# Patient Record
Sex: Female | Born: 1955 | Race: Black or African American | Hispanic: No | Smoking: Current every day smoker
Health system: Southern US, Community
[De-identification: ages and names within clinical notes are randomized; demographics above are authoritative.]

## PROBLEM LIST (undated history)

## (undated) DIAGNOSIS — M1990 Unspecified osteoarthritis, unspecified site: Secondary | ICD-10-CM

## (undated) DIAGNOSIS — F419 Anxiety disorder, unspecified: Secondary | ICD-10-CM

## (undated) DIAGNOSIS — N390 Urinary tract infection, site not specified: Secondary | ICD-10-CM

## (undated) DIAGNOSIS — R51 Headache: Secondary | ICD-10-CM

## (undated) DIAGNOSIS — C189 Malignant neoplasm of colon, unspecified: Secondary | ICD-10-CM

## (undated) DIAGNOSIS — Z87442 Personal history of urinary calculi: Secondary | ICD-10-CM

## (undated) DIAGNOSIS — E119 Type 2 diabetes mellitus without complications: Secondary | ICD-10-CM

## (undated) DIAGNOSIS — I10 Essential (primary) hypertension: Secondary | ICD-10-CM

## (undated) DIAGNOSIS — R519 Headache, unspecified: Secondary | ICD-10-CM

## (undated) HISTORY — DX: Malignant neoplasm of colon, unspecified: C18.9

## (undated) HISTORY — PX: HERNIA REPAIR: SHX51

## (undated) HISTORY — PX: ABDOMINAL HYSTERECTOMY: SHX81

## (undated) HISTORY — PX: BACK SURGERY: SHX140

## (undated) HISTORY — DX: Anxiety disorder, unspecified: F41.9

## (undated) HISTORY — DX: Type 2 diabetes mellitus without complications: E11.9

## (undated) HISTORY — PX: CHOLECYSTECTOMY: SHX55

## (undated) HISTORY — DX: Essential (primary) hypertension: I10

---

## 2003-06-20 DIAGNOSIS — E1169 Type 2 diabetes mellitus with other specified complication: Secondary | ICD-10-CM | POA: Diagnosis present

## 2006-06-03 DIAGNOSIS — E669 Obesity, unspecified: Secondary | ICD-10-CM | POA: Insufficient documentation

## 2006-10-24 DIAGNOSIS — G8929 Other chronic pain: Secondary | ICD-10-CM | POA: Insufficient documentation

## 2007-08-31 HISTORY — PX: COLON SURGERY: SHX602

## 2008-01-30 ENCOUNTER — Ambulatory Visit: Payer: Self-pay | Admitting: Cardiology

## 2014-08-30 HISTORY — PX: EUS: SHX5427

## 2014-10-31 ENCOUNTER — Other Ambulatory Visit: Payer: Self-pay | Admitting: Neurology

## 2014-10-31 DIAGNOSIS — M545 Low back pain: Secondary | ICD-10-CM

## 2014-11-11 ENCOUNTER — Ambulatory Visit (HOSPITAL_COMMUNITY)
Admission: RE | Admit: 2014-11-11 | Discharge: 2014-11-11 | Disposition: A | Discharge status: Home / Self Care | Payer: Medicare Other | Source: Ambulatory Visit | Attending: Neurology | Admitting: Neurology

## 2014-11-11 DIAGNOSIS — M5416 Radiculopathy, lumbar region: Secondary | ICD-10-CM | POA: Diagnosis present

## 2014-11-11 DIAGNOSIS — M545 Low back pain: Secondary | ICD-10-CM

## 2017-01-06 ENCOUNTER — Other Ambulatory Visit (HOSPITAL_COMMUNITY): Payer: Self-pay | Admitting: Neurology

## 2017-01-06 ENCOUNTER — Ambulatory Visit (HOSPITAL_COMMUNITY)
Admission: RE | Admit: 2017-01-06 | Discharge: 2017-01-06 | Disposition: A | Discharge status: Home / Self Care | Payer: Medicare Other | Source: Ambulatory Visit | Attending: Neurology | Admitting: Neurology

## 2017-01-06 DIAGNOSIS — R937 Abnormal findings on diagnostic imaging of other parts of musculoskeletal system: Secondary | ICD-10-CM | POA: Diagnosis not present

## 2017-01-06 DIAGNOSIS — S99911A Unspecified injury of right ankle, initial encounter: Secondary | ICD-10-CM | POA: Insufficient documentation

## 2017-01-06 DIAGNOSIS — W19XXXA Unspecified fall, initial encounter: Secondary | ICD-10-CM

## 2017-01-06 DIAGNOSIS — W000XXA Fall on same level due to ice and snow, initial encounter: Secondary | ICD-10-CM | POA: Insufficient documentation

## 2017-03-23 DIAGNOSIS — I10 Essential (primary) hypertension: Secondary | ICD-10-CM | POA: Insufficient documentation

## 2017-09-30 HISTORY — PX: COLONOSCOPY: SHX174

## 2018-02-04 ENCOUNTER — Emergency Department (HOSPITAL_COMMUNITY): Payer: Medicare Other

## 2018-02-04 ENCOUNTER — Inpatient Hospital Stay (HOSPITAL_COMMUNITY)
Admission: EM | Admit: 2018-02-04 | Discharge: 2018-02-08 | DRG: 392 | Disposition: A | Discharge status: Home / Self Care | Payer: Medicare Other | Attending: Internal Medicine | Admitting: Internal Medicine

## 2018-02-04 ENCOUNTER — Encounter (HOSPITAL_COMMUNITY): Payer: Self-pay | Admitting: Emergency Medicine

## 2018-02-04 DIAGNOSIS — Z79899 Other long term (current) drug therapy: Secondary | ICD-10-CM

## 2018-02-04 DIAGNOSIS — R000 Tachycardia, unspecified: Secondary | ICD-10-CM | POA: Diagnosis present

## 2018-02-04 DIAGNOSIS — K208 Other esophagitis: Secondary | ICD-10-CM | POA: Diagnosis present

## 2018-02-04 DIAGNOSIS — F419 Anxiety disorder, unspecified: Secondary | ICD-10-CM | POA: Diagnosis present

## 2018-02-04 DIAGNOSIS — Z7984 Long term (current) use of oral hypoglycemic drugs: Secondary | ICD-10-CM

## 2018-02-04 DIAGNOSIS — E119 Type 2 diabetes mellitus without complications: Secondary | ICD-10-CM | POA: Diagnosis present

## 2018-02-04 DIAGNOSIS — R112 Nausea with vomiting, unspecified: Secondary | ICD-10-CM

## 2018-02-04 DIAGNOSIS — Z85038 Personal history of other malignant neoplasm of large intestine: Secondary | ICD-10-CM | POA: Diagnosis present

## 2018-02-04 DIAGNOSIS — F17210 Nicotine dependence, cigarettes, uncomplicated: Secondary | ICD-10-CM | POA: Diagnosis present

## 2018-02-04 DIAGNOSIS — M5126 Other intervertebral disc displacement, lumbar region: Secondary | ICD-10-CM | POA: Diagnosis present

## 2018-02-04 DIAGNOSIS — Z888 Allergy status to other drugs, medicaments and biological substances status: Secondary | ICD-10-CM

## 2018-02-04 DIAGNOSIS — E785 Hyperlipidemia, unspecified: Secondary | ICD-10-CM | POA: Diagnosis present

## 2018-02-04 DIAGNOSIS — R197 Diarrhea, unspecified: Secondary | ICD-10-CM

## 2018-02-04 DIAGNOSIS — Z90710 Acquired absence of both cervix and uterus: Secondary | ICD-10-CM | POA: Diagnosis not present

## 2018-02-04 DIAGNOSIS — I10 Essential (primary) hypertension: Secondary | ICD-10-CM | POA: Diagnosis present

## 2018-02-04 DIAGNOSIS — Z79891 Long term (current) use of opiate analgesic: Secondary | ICD-10-CM | POA: Diagnosis not present

## 2018-02-04 DIAGNOSIS — Z882 Allergy status to sulfonamides status: Secondary | ICD-10-CM | POA: Diagnosis not present

## 2018-02-04 DIAGNOSIS — M545 Low back pain: Secondary | ICD-10-CM

## 2018-02-04 DIAGNOSIS — R109 Unspecified abdominal pain: Secondary | ICD-10-CM | POA: Diagnosis present

## 2018-02-04 DIAGNOSIS — E860 Dehydration: Secondary | ICD-10-CM

## 2018-02-04 DIAGNOSIS — Z720 Tobacco use: Secondary | ICD-10-CM | POA: Diagnosis present

## 2018-02-04 DIAGNOSIS — Z7982 Long term (current) use of aspirin: Secondary | ICD-10-CM

## 2018-02-04 DIAGNOSIS — M5135 Other intervertebral disc degeneration, thoracolumbar region: Secondary | ICD-10-CM | POA: Diagnosis present

## 2018-02-04 DIAGNOSIS — Z9049 Acquired absence of other specified parts of digestive tract: Secondary | ICD-10-CM

## 2018-02-04 DIAGNOSIS — Z91041 Radiographic dye allergy status: Secondary | ICD-10-CM

## 2018-02-04 DIAGNOSIS — R1032 Left lower quadrant pain: Secondary | ICD-10-CM

## 2018-02-04 DIAGNOSIS — R1031 Right lower quadrant pain: Secondary | ICD-10-CM

## 2018-02-04 DIAGNOSIS — K529 Noninfective gastroenteritis and colitis, unspecified: Secondary | ICD-10-CM | POA: Diagnosis present

## 2018-02-04 DIAGNOSIS — R7989 Other specified abnormal findings of blood chemistry: Secondary | ICD-10-CM | POA: Diagnosis present

## 2018-02-04 DIAGNOSIS — G8929 Other chronic pain: Secondary | ICD-10-CM | POA: Diagnosis present

## 2018-02-04 DIAGNOSIS — Z716 Tobacco abuse counseling: Secondary | ICD-10-CM

## 2018-02-04 DIAGNOSIS — E1142 Type 2 diabetes mellitus with diabetic polyneuropathy: Secondary | ICD-10-CM

## 2018-02-04 LAB — URINALYSIS, ROUTINE W REFLEX MICROSCOPIC
Bilirubin Urine: NEGATIVE
KETONES UR: NEGATIVE mg/dL
LEUKOCYTES UA: NEGATIVE
NITRITE: NEGATIVE
PH: 5 (ref 5.0–8.0)
Protein, ur: 30 mg/dL — AB
Specific Gravity, Urine: 1.018 (ref 1.005–1.030)

## 2018-02-04 LAB — GLUCOSE, CAPILLARY: Glucose-Capillary: 178 mg/dL — ABNORMAL HIGH (ref 65–99)

## 2018-02-04 LAB — COMPREHENSIVE METABOLIC PANEL
ALK PHOS: 98 U/L (ref 38–126)
ALT: 20 U/L (ref 14–54)
ANION GAP: 13 (ref 5–15)
AST: 23 U/L (ref 15–41)
Albumin: 4.5 g/dL (ref 3.5–5.0)
BUN: 15 mg/dL (ref 6–20)
CALCIUM: 9.4 mg/dL (ref 8.9–10.3)
CO2: 26 mmol/L (ref 22–32)
CREATININE: 0.73 mg/dL (ref 0.44–1.00)
Chloride: 98 mmol/L — ABNORMAL LOW (ref 101–111)
Glucose, Bld: 280 mg/dL — ABNORMAL HIGH (ref 65–99)
Potassium: 4.2 mmol/L (ref 3.5–5.1)
SODIUM: 137 mmol/L (ref 135–145)
Total Bilirubin: 0.8 mg/dL (ref 0.3–1.2)
Total Protein: 8.7 g/dL — ABNORMAL HIGH (ref 6.5–8.1)

## 2018-02-04 LAB — TROPONIN I: TROPONIN I: 0.03 ng/mL — AB (ref <0.03)

## 2018-02-04 LAB — CBC
HCT: 45.1 % (ref 36.0–46.0)
HEMOGLOBIN: 15.3 g/dL — AB (ref 12.0–15.0)
MCH: 30.7 pg (ref 26.0–34.0)
MCHC: 33.9 g/dL (ref 30.0–36.0)
MCV: 90.4 fL (ref 78.0–100.0)
PLATELETS: 312 10*3/uL (ref 150–400)
RBC: 4.99 MIL/uL (ref 3.87–5.11)
RDW: 13 % (ref 11.5–15.5)
WBC: 7.9 10*3/uL (ref 4.0–10.5)

## 2018-02-04 LAB — D-DIMER, QUANTITATIVE: D-Dimer, Quant: 0.81 ug/mL-FEU — ABNORMAL HIGH (ref 0.00–0.50)

## 2018-02-04 LAB — LIPASE, BLOOD: LIPASE: 26 U/L (ref 11–51)

## 2018-02-04 LAB — I-STAT CG4 LACTIC ACID, ED
Lactic Acid, Venous: 2.25 mmol/L (ref 0.5–1.9)
Lactic Acid, Venous: 1.67 mmol/L (ref 0.5–1.9)

## 2018-02-04 MED ORDER — SODIUM CHLORIDE 0.9 % IV BOLUS
1000.0000 mL | Freq: Once | INTRAVENOUS | Status: AC
  Administered 2018-02-04: 1000 mL via INTRAVENOUS

## 2018-02-04 MED ORDER — VITAMIN D3 25 MCG (1000 UNIT) PO TABS
1000.0000 [IU] | ORAL_TABLET | Freq: Every day | ORAL | Status: DC
  Administered 2018-02-05 – 2018-02-08 (×4): 1000 [IU] via ORAL
  Filled 2018-02-04 (×4): qty 1

## 2018-02-04 MED ORDER — LISINOPRIL-HYDROCHLOROTHIAZIDE 20-12.5 MG PO TABS: 1.0000 | ORAL_TABLET | Freq: Every day | ORAL | Status: DC

## 2018-02-04 MED ORDER — MORPHINE SULFATE (PF) 4 MG/ML IV SOLN
4.0000 mg | Freq: Once | INTRAVENOUS | Status: AC
  Administered 2018-02-04: 4 mg via INTRAVENOUS
  Filled 2018-02-04: qty 1

## 2018-02-04 MED ORDER — NICOTINE 21 MG/24HR TD PT24
21.0000 mg | MEDICATED_PATCH | Freq: Every day | TRANSDERMAL | Status: DC
  Administered 2018-02-06: 21 mg via TRANSDERMAL
  Filled 2018-02-04 (×4): qty 1

## 2018-02-04 MED ORDER — ENOXAPARIN SODIUM 40 MG/0.4ML ~~LOC~~ SOLN
40.0000 mg | Freq: Every day | SUBCUTANEOUS | Status: DC
Start: 2018-02-04 — End: 2018-02-08
  Administered 2018-02-04 – 2018-02-07 (×4): 40 mg via SUBCUTANEOUS
  Filled 2018-02-04 (×4): qty 0.4

## 2018-02-04 MED ORDER — METOPROLOL TARTRATE 25 MG PO TABS
25.0000 mg | ORAL_TABLET | Freq: Every day | ORAL | Status: DC
  Administered 2018-02-05: 25 mg via ORAL
  Filled 2018-02-04: qty 1

## 2018-02-04 MED ORDER — SIMVASTATIN 40 MG PO TABS
40.0000 mg | ORAL_TABLET | Freq: Every day | ORAL | Status: DC
  Administered 2018-02-05: 40 mg via ORAL
  Filled 2018-02-04: qty 1

## 2018-02-04 MED ORDER — HYDROCODONE-ACETAMINOPHEN 10-325 MG PO TABS
1.0000 | ORAL_TABLET | ORAL | Status: DC
  Administered 2018-02-04 – 2018-02-06 (×8): 1 via ORAL
  Filled 2018-02-04 (×9): qty 1

## 2018-02-04 MED ORDER — DIPHENHYDRAMINE HCL 25 MG PO CAPS
50.0000 mg | ORAL_CAPSULE | Freq: Every day | ORAL | Status: DC
Start: 2018-02-04 — End: 2018-02-08
  Administered 2018-02-04 – 2018-02-07 (×3): 50 mg via ORAL
  Filled 2018-02-04 (×4): qty 2

## 2018-02-04 MED ORDER — ALPRAZOLAM 1 MG PO TABS: 1.0000 mg | ORAL_TABLET | Freq: Two times a day (BID) | ORAL | Status: DC | PRN

## 2018-02-04 MED ORDER — MORPHINE SULFATE (PF) 2 MG/ML IV SOLN
2.0000 mg | INTRAVENOUS | Status: DC | PRN
  Administered 2018-02-04 – 2018-02-06 (×5): 2 mg via INTRAVENOUS
  Filled 2018-02-04 (×6): qty 1

## 2018-02-04 MED ORDER — ASPIRIN EC 325 MG PO TBEC
325.0000 mg | DELAYED_RELEASE_TABLET | Freq: Every day | ORAL | Status: DC
  Administered 2018-02-05 – 2018-02-08 (×4): 325 mg via ORAL
  Filled 2018-02-04 (×4): qty 1

## 2018-02-04 MED ORDER — VITAMIN B-12 1000 MCG PO TABS
1000.0000 ug | ORAL_TABLET | Freq: Every day | ORAL | Status: DC
  Administered 2018-02-05 – 2018-02-08 (×4): 1000 ug via ORAL
  Filled 2018-02-04 (×4): qty 1

## 2018-02-04 MED ORDER — ONDANSETRON HCL 4 MG/2ML IJ SOLN
4.0000 mg | Freq: Once | INTRAMUSCULAR | Status: AC
  Administered 2018-02-04: 4 mg via INTRAVENOUS
  Filled 2018-02-04: qty 2

## 2018-02-04 MED ORDER — TIZANIDINE HCL 4 MG PO TABS
4.0000 mg | ORAL_TABLET | Freq: Every day | ORAL | Status: DC
  Administered 2018-02-04 – 2018-02-07 (×4): 4 mg via ORAL
  Filled 2018-02-04 (×4): qty 1

## 2018-02-04 MED ORDER — CLONIDINE HCL 0.1 MG PO TABS
0.1000 mg | ORAL_TABLET | Freq: Every day | ORAL | Status: DC
Start: 2018-02-05 — End: 2018-02-08
  Administered 2018-02-05 – 2018-02-08 (×4): 0.1 mg via ORAL
  Filled 2018-02-04 (×4): qty 1

## 2018-02-04 MED ORDER — SODIUM CHLORIDE 0.9 % IV BOLUS
500.0000 mL | Freq: Once | INTRAVENOUS | Status: AC
  Administered 2018-02-04: 500 mL via INTRAVENOUS

## 2018-02-04 MED ORDER — POTASSIUM CHLORIDE CRYS ER 20 MEQ PO TBCR
20.0000 meq | EXTENDED_RELEASE_TABLET | Freq: Every day | ORAL | Status: DC
  Administered 2018-02-05 – 2018-02-08 (×4): 20 meq via ORAL
  Filled 2018-02-04 (×4): qty 1

## 2018-02-04 MED ORDER — HYDROCHLOROTHIAZIDE 12.5 MG PO CAPS
12.5000 mg | ORAL_CAPSULE | Freq: Every day | ORAL | Status: DC
  Administered 2018-02-05 – 2018-02-08 (×4): 12.5 mg via ORAL
  Filled 2018-02-04 (×4): qty 1

## 2018-02-04 MED ORDER — ONDANSETRON HCL 4 MG/2ML IJ SOLN
4.0000 mg | Freq: Four times a day (QID) | INTRAMUSCULAR | Status: DC | PRN
  Administered 2018-02-06 – 2018-02-07 (×2): 4 mg via INTRAVENOUS
  Filled 2018-02-04 (×2): qty 2

## 2018-02-04 MED ORDER — GLIPIZIDE 10 MG PO TABS
10.0000 mg | ORAL_TABLET | Freq: Two times a day (BID) | ORAL | Status: DC
  Administered 2018-02-05 – 2018-02-08 (×6): 10 mg via ORAL
  Filled 2018-02-04 (×7): qty 1

## 2018-02-04 MED ORDER — HALOPERIDOL LACTATE 5 MG/ML IJ SOLN
2.5000 mg | Freq: Once | INTRAMUSCULAR | Status: AC
  Administered 2018-02-04: 2.5 mg via INTRAVENOUS
  Filled 2018-02-04: qty 1

## 2018-02-04 MED ORDER — SODIUM CHLORIDE 0.9 % IV SOLN
INTRAVENOUS | Status: DC
  Administered 2018-02-04: 23:00:00 via INTRAVENOUS

## 2018-02-04 MED ORDER — LISINOPRIL 20 MG PO TABS
20.0000 mg | ORAL_TABLET | Freq: Every day | ORAL | Status: DC
  Administered 2018-02-05 – 2018-02-08 (×4): 20 mg via ORAL
  Filled 2018-02-04 (×4): qty 1

## 2018-02-04 MED ORDER — INSULIN ASPART 100 UNIT/ML ~~LOC~~ SOLN
0.0000 [IU] | Freq: Three times a day (TID) | SUBCUTANEOUS | Status: DC
  Administered 2018-02-05 (×2): 3 [IU] via SUBCUTANEOUS
  Administered 2018-02-05: 5 [IU] via SUBCUTANEOUS
  Administered 2018-02-06: 7 [IU] via SUBCUTANEOUS

## 2018-02-04 MED ORDER — ONDANSETRON HCL 4 MG PO TABS: 4.0000 mg | ORAL_TABLET | Freq: Four times a day (QID) | ORAL | Status: DC | PRN

## 2018-02-04 NOTE — H&P (Signed)
History and Physical    Candice Mccann IZT:[id number] DOB: [birth year] DOA: 02/04/2018  Referring MD/NP/PA: Benedetto Goad, PA  PCP: Celedonio Savage, MD   Outpatient Specialists: None   Patient coming from: Home  Chief Complaint: Abdominal pain, nausea with vomiting  HPI: Candice Mccann is a 62 y.o. female with medical history significant of diabetes, colon cancer status post resection, degenerative disc disease status post previous back surgeries, hypertension, hyperlipidemia and anxiety disorder who presented to the ER with persistent left flank pain going to her abdomen with nausea and vomiting.  Symptoms have been going on for several weeks on and off.  She has been to different emergency rooms to have that evaluated.  Has had CT scans and other test results in [place] which were all negative.  Family brought her here to the ER for further evaluation.  Patient's work-up so far has been negative however she was noted to have persistent sinus tachycardia.  Despite fluid resuscitation and pain medications her heart rate has remained in the 120s to 140s.  Family reported that they have been using a lot of pesticides around the house.  She does not seem to have other symptoms of organophosphate poisoning.  Not clear what other chemicals in the pesticides that the use.  Family reported previous back surgeries.  The nature of the pain is from the back to the front.  Rated as 10 out of 10 not relieved by anything aggravated by movement and even at rest.  ED Course: Patient's vitals indicate a blood pressure of 203 about 117 pulse of 128 respiratory 20 and oxygen sat 93% on room air.  White count is 7.9 with hemoglobin 15.3.  Troponin 0 0.03 chloride 98 but regular creatinine.  Glucose is 280.  Review of Systems: As per HPI otherwise 10 point review of systems negative.    Past Medical History:  Diagnosis Date  . Cancer [facility])    colon cancer     Past Surgical History:  Procedure  Laterality Date  . ABDOMINAL HYSTERECTOMY    . APPENDECTOMY    . BACK SURGERY    . HERNIA REPAIR       reports that she has been smoking cigarettes.  She has never used smokeless tobacco. She reports that she drank alcohol. Her drug history is not on file.  Allergies  Allergen Reactions  . Contrast Media [Iodinated Diagnostic Agents] Anaphylaxis and Nausea And Vomiting  . Nitroglycerin Other (See Comments)    Cardiac arrest  . Sulfa Antibiotics Rash    No family history on file.   Prior to Admission medications   Medication Sig Start Date End Date Taking? Authorizing Provider  ALPRAZolam Duanne Moron) 1 MG tablet Take 1 mg by mouth 2 (two) times daily as needed for anxiety.   Yes [provider]  aspirin 325 MG EC tablet Take 325 mg by mouth daily.   Yes [provider]  cholecalciferol (VITAMIN D) 1000 units tablet Take 1,000 Units by mouth daily.   Yes [provider]  cloNIDine (CATAPRES) 0.1 MG tablet Take 0.1 mg by mouth daily.   Yes [provider]  diphenhydramine-acetaminophen (TYLENOL PM) 25-500 MG TABS tablet Take 2 tablets by mouth at bedtime.   Yes [provider]  glipiZIDE (GLUCOTROL) 10 MG tablet Take 10 mg by mouth 2 (two) times daily before a meal.   Yes [provider]  HYDROcodone-acetaminophen (NORCO) 10-325 MG tablet Take 1 tablet by mouth every 4 (four)  hours.   Yes [provider]  lisinopril-hydrochlorothiazide (PRINZIDE,ZESTORETIC) 20-12.5 MG tablet Take 1 tablet by mouth daily.   Yes [provider]  metFORMIN (GLUCOPHAGE) 500 MG tablet Take 500 mg by mouth 2 (two) times daily with a meal.   Yes [provider]  metoprolol tartrate (LOPRESSOR) 25 MG tablet Take 25 mg by mouth daily.   Yes [provider]  potassium chloride SA (K-DUR,KLOR-CON) 20 MEQ tablet Take 20 mEq by mouth daily.   Yes [provider]  simvastatin (ZOCOR) 40 MG tablet Take 40 mg by mouth daily  at 6 PM.   Yes [provider]  tiZANidine (ZANAFLEX) 4 MG capsule Take 4 mg by mouth at bedtime.   Yes [provider]  vitamin B-12 (CYANOCOBALAMIN) 1000 MCG tablet Take 1,000 mcg by mouth daily.   Yes [provider]    Physical Exam: Vitals:   02/04/18 1807 02/04/18 1830 02/04/18 1837 02/04/18 1900  BP: (!) 168/102 (!) 165/110 (!) 165/110 (!) 183/113  Pulse: (!) 114 (!) 123 (!) 123 (!) 123  Resp: 16  16   Temp:      TempSrc:      SpO2: 97% 93% 93% 95%  Weight:      Height:          Constitutional: NAD, calm, comfortable Vitals:   02/04/18 1807 02/04/18 1830 02/04/18 1837 02/04/18 1900  BP: (!) 168/102 (!) 165/110 (!) 165/110 (!) 183/113  Pulse: (!) 114 (!) 123 (!) 123 (!) 123  Resp: 16  16   Temp:      TempSrc:      SpO2: 97% 93% 93% 95%  Weight:      Height:       Eyes: PERRL, lids and conjunctivae normal ENMT: Mucous membranes are moist. Posterior pharynx clear of any exudate or lesions.Normal dentition.  Neck: normal, supple, no masses, no thyromegaly Respiratory: clear to auscultation bilaterally, no wheezing, no crackles. Normal respiratory effort. No accessory muscle use.  Cardiovascular: Regular rate and rhythm, no murmurs / rubs / gallops. No extremity edema. 2+ pedal pulses. No carotid bruits.  Abdomen: Diffuse tenderness, no masses palpated. No hepatosplenomegaly. Bowel sounds positive.  Musculoskeletal: no clubbing / cyanosis. No joint deformity upper and lower extremities. Good ROM, no contractures. Normal muscle tone.  Skin: no rashes, lesions, ulcers. No induration Neurologic: CN 2-12 grossly intact. Sensation intact, DTR normal. Strength 5/5 in all 4.  Psychiatric: Normal judgment and insight. Alert and oriented x 3. Normal mood.   Labs on Admission: I have personally reviewed following labs and imaging studies  CBC: Recent Labs  Lab 02/04/18 1351  WBC 7.9  HGB 15.3*  HCT 45.1  MCV 90.4  PLT 347   Basic Metabolic  Panel: Recent Labs  Lab 02/04/18 1351  NA 137  K 4.2  CL 98*  CO2 26  GLUCOSE 280*  BUN 15  CREATININE 0.73  CALCIUM 9.4   GFR: Estimated Creatinine Clearance: 69.3 mL/min (by C-G formula based on SCr of 0.73 mg/dL). Liver Function Tests: Recent Labs  Lab 02/04/18 1351  AST 23  ALT 20  ALKPHOS 98  BILITOT 0.8  PROT 8.7*  ALBUMIN 4.5   Recent Labs  Lab 02/04/18 1351  LIPASE 26   No results for input(s): AMMONIA in the last 168 hours. Coagulation Profile: No results for input(s): INR, PROTIME in the last 168 hours. Cardiac Enzymes: Recent Labs  Lab 02/04/18 1522  TROPONINI 0.03*   BNP (last 3 results)  No results for input(s): PROBNP in the last 8760 hours. HbA1C: No results for input(s): HGBA1C in the last 72 hours. CBG: No results for input(s): GLUCAP in the last 168 hours. Lipid Profile: No results for input(s): CHOL, HDL, LDLCALC, TRIG, CHOLHDL, LDLDIRECT in the last 72 hours. Thyroid Function Tests: No results for input(s): TSH, T4TOTAL, FREET4, T3FREE, THYROIDAB in the last 72 hours. Anemia Panel: No results for input(s): VITAMINB12, FOLATE, FERRITIN, TIBC, IRON, RETICCTPCT in the last 72 hours. Urine analysis:    Component Value Date/Time   COLORURINE YELLOW 02/04/2018 1351   APPEARANCEUR CLEAR 02/04/2018 1351   LABSPEC 1.018 02/04/2018 1351   PHURINE 5.0 02/04/2018 1351   GLUCOSEU >=500 (A) 02/04/2018 1351   HGBUR MODERATE (A) 02/04/2018 1351   BILIRUBINUR NEGATIVE 02/04/2018 1351   KETONESUR NEGATIVE 02/04/2018 1351   PROTEINUR 30 (A) 02/04/2018 1351   NITRITE NEGATIVE 02/04/2018 1351   LEUKOCYTESUR NEGATIVE 02/04/2018 1351   Sepsis Labs: @LABRCNTIP (procalcitonin:4,lacticidven:4) )No results found for this or any previous visit (from the past 240 hour(s)).   Radiological Exams on Admission: Dg Abdomen Acute W/chest  Result Date: 02/04/2018 CLINICAL DATA:  Several weeks of abdominal pain radiating to back with nausea and vomiting. EXAM: DG  ABDOMEN ACUTE W/ 1V CHEST COMPARISON:  None. FINDINGS: Chest radiograph demonstrates the lungs to be clear. Cardiomediastinal silhouette is within normal. Remainder of the chest radiograph is unremarkable. Abdominopelvic films demonstrate a nonobstructive bowel gas pattern. There is no focal mass or mass effect. No free peritoneal air. Surgical clips over the right upper quadrant. Minimal degenerate change of the spine. IMPRESSION: Nonobstructive bowel gas pattern. No acute cardiopulmonary disease. Electronically Signed   By: Marin Olp M.D.   On: 02/04/2018 20:11    EKG: Independently reviewed.  Showed sinus tachycardia with a rate of 111.  No ST changes  Assessment/Plan Principal Problem:   Sinus tachycardia Active Problems:   Tobacco abuse   Abdominal pain   History of colon cancer   Benign essential HTN   DDD (degenerative disc disease), thoracolumbar   DM (diabetes mellitus) (HCC)   Hyperlipemia   Anxiety disorder     #1 sinus tachycardia: Most likely related to patient's abdominal pain and pain in general.  Hydrated so far.  Admit the patient to telemetry.  Evaluate for other possible causes.  D-dimer is only minimally elevated however patient cannot get contrast.  We may have to do a VQ scan in the morning if no improvement.  There is question of possible pesticides poisoning and we do not know what chemicals she got exposed to. Organophosphates will cause bradycardia so this is less likely to be the cause.  #2 recurrent abdominal pain: again work-up so far negative.  I suspect radicular pain from the lumbar spine.  With her history of back surgery and degenerative disc disease, we will order an MRI of the lumbar spine and thoracic spine.  #3 degenerative disc disease of the spine: As indicated above recheck MRI.  Pain management.  #4  diabetes: Patient on oral hypoglycemics.  Hold metformin and get sliding scale insulin.  #5 anxiety disorder: Continue with home regimen.  #6  hypertension: Continue with home regimen and monitor blood pressure closely  #7 hyperlipidemia: Again continue with patient's home regimen.  #8 tobacco abuse: Tobacco cessation counseling given.  Add nicotine patch   DVT prophylaxis: Lovenox  Code Status: Full  Family Communication: Husband at bedside Disposition Plan: To be determined  Consults called: None  Admission status: Inpatient  Severity of Illness: The appropriate patient status for this patient is INPATIENT. Inpatient status is judged to be reasonable and necessary in order to provide the required intensity of service to ensure the patient's safety. The patient's presenting symptoms, physical exam findings, and initial radiographic and laboratory data in the context of their chronic comorbidities is felt to place them at high risk for further clinical deterioration. Furthermore, it is not anticipated that the patient will be medically stable for discharge from the hospital within 2 midnights of admission. The following factors support the patient status of inpatient.   " The patient's presenting symptoms include left flank to abdominal pain. " The worrisome physical exam findings include tenderness in the abdomen and left flank. " The initial radiographic and laboratory data are worrisome because of negative CT but increased d-dimer. " The chronic co-morbidities include history of colon cancer.   * I certify that at the point of admission it is my clinical judgment that the patient will require inpatient hospital care spanning beyond 2 midnights from the point of admission due to high intensity of service, high risk for further deterioration and high frequency of surveillance required.Barbette Merino MD [facility] Pager [phone]  If 7PM-7AM, please contact night-coverage [email or web address] Password [email or web address]  02/04/2018, 9:20 PM

## 2018-02-04 NOTE — ED Notes (Signed)
ED TO INPATIENT HANDOFF REPORT  Name/Age/Gender Candice Mccann 62 y.o. female  Code Status   Home/SNF/Other Home  Chief Complaint side pain  Level of Care/Admitting Diagnosis ED Disposition    ED Disposition Condition [facility] Area: [place] [100102]  Level of Care: Telemetry [5]  Admit to tele based on following criteria: Complex arrhythmia (Bradycardia/Tachycardia)  Diagnosis: Sinus tachycardia [[id number]]  Admitting Physician: Elwyn Reach [[id number]]  Attending Physician: Elwyn Reach [[id number]]  Estimated length of stay: past midnight tomorrow  Certification:: I certify this patient will need inpatient services for at least 2 midnights  PT Class (Do Not Modify): Inpatient [101]  PT Acc Code (Do Not Modify): Private [1]       Medical History Past Medical History:  Diagnosis Date  . Cancer [facility])    colon cancer     Allergies Allergies  Allergen Reactions  . Contrast Media [Iodinated Diagnostic Agents] Anaphylaxis and Nausea And Vomiting  . Nitroglycerin Other (See Comments)    Cardiac arrest  . Sulfa Antibiotics Rash    IV Location/Drains/Wounds Patient Lines/Drains/Airways Status   Active Line/Drains/Airways    Name:   Placement date:   Placement time:   Site:   Days:   Peripheral IV 02/04/18 Left Antecubital   02/04/18    1423    Antecubital   less than 1          Labs/Imaging Results for orders placed or performed during the hospital encounter of 02/04/18 (from the past 48 hour(s))  Lipase, blood     Status: None   Collection Time: 02/04/18  1:51 PM  Result Value Ref Range   Lipase 26 11 - 51 U/L    Comment: Performed at [facility], [place] [address] [place], [place] [address]  Comprehensive metabolic panel     Status: Abnormal   Collection Time: 02/04/18  1:51 PM  Result Value Ref Range   Sodium 137 135 - 145 mmol/L   Potassium 4.2 3.5 - 5.1 mmol/L   Chloride 98 (L) 101 - 111  mmol/L   CO2 26 22 - 32 mmol/L   Glucose, Bld 280 (H) 65 - 99 mg/dL   BUN 15 6 - 20 mg/dL   Creatinine, Ser 0.73 0.44 - 1.00 mg/dL   Calcium 9.4 8.9 - 10.3 mg/dL   Total Protein 8.7 (H) 6.5 - 8.1 g/dL   Albumin 4.5 3.5 - 5.0 g/dL   AST 23 15 - 41 U/L   ALT 20 14 - 54 U/L   Alkaline Phosphatase 98 38 - 126 U/L   Total Bilirubin 0.8 0.3 - 1.2 mg/dL   GFR calc non Af Amer >60 >60 mL/min   GFR calc Af Amer >60 >60 mL/min    Comment: (NOTE) The eGFR has been calculated using the CKD EPI equation. This calculation has not been validated in all clinical situations. eGFR's persistently <60 mL/min signify possible Chronic Kidney Disease.    Anion gap 13 5 - 15    Comment: Performed at [facility], [place] [address], [place], [place] [address]  CBC     Status: Abnormal   Collection Time: 02/04/18  1:51 PM  Result Value Ref Range   WBC 7.9 4.0 - 10.5 K/uL   RBC 4.99 3.87 - 5.11 MIL/uL   Hemoglobin 15.3 (H) 12.0 - 15.0 g/dL   HCT 45.1 36.0 - 46.0 %   MCV 90.4 78.0 - 100.0 fL  MCH 30.7 26.0 - 34.0 pg   MCHC 33.9 30.0 - 36.0 g/dL   RDW 13.0 11.5 - 15.5 %   Platelets 312 150 - 400 K/uL    Comment: Performed at [facility], [place] [address], [place], [place] [address]  Urinalysis, Routine w reflex microscopic     Status: Abnormal   Collection Time: 02/04/18  1:51 PM  Result Value Ref Range   Color, Urine YELLOW YELLOW   APPearance CLEAR CLEAR   Specific Gravity, Urine 1.018 1.005 - 1.030   pH 5.0 5.0 - 8.0   Glucose, UA >=500 (A) NEGATIVE mg/dL   Hgb urine dipstick MODERATE (A) NEGATIVE   Bilirubin Urine NEGATIVE NEGATIVE   Ketones, ur NEGATIVE NEGATIVE mg/dL   Protein, ur 30 (A) NEGATIVE mg/dL   Nitrite NEGATIVE NEGATIVE   Leukocytes, UA NEGATIVE NEGATIVE   RBC / HPF 0-5 0 - 5 RBC/hpf   WBC, UA 0-5 0 - 5 WBC/hpf   Bacteria, UA RARE (A) NONE SEEN   Squamous Epithelial / LPF 6-10 0 - 5   Hyaline Casts, UA PRESENT     Comment: Performed at  [facility], [place] [address], [place], [place] [address]  Troponin I     Status: Abnormal   Collection Time: 02/04/18  3:22 PM  Result Value Ref Range   Troponin I 0.03 (HH) <0.03 ng/mL    Comment: CRITICAL RESULT CALLED TO, READ BACK BY AND VERIFIED WITH: J.Everlean Patterson 02/04/18 _0  BY V.WILKINS Performed at [facility], [place] [address] [place], [place] [address]   I-Stat CG4 Lactic Acid, ED     Status: Abnormal   Collection Time: 02/04/18  3:53 PM  Result Value Ref Range   Lactic Acid, Venous 2.25 (HH) 0.5 - 1.9 mmol/L   Comment NOTIFIED PHYSICIAN   I-Stat CG4 Lactic Acid, ED     Status: None   Collection Time: 02/04/18  6:14 PM  Result Value Ref Range   Lactic Acid, Venous 1.67 0.5 - 1.9 mmol/L  D-dimer, quantitative (not at [facility])     Status: Abnormal   Collection Time: 02/04/18  8:31 PM  Result Value Ref Range   D-Dimer, Quant 0.81 (H) 0.00 - 0.50 ug/mL-FEU    Comment: (NOTE) At the manufacturer cut-off of 0.50 ug/mL FEU, this assay has been documented to exclude PE with a sensitivity and negative predictive value of 97 to 99%.  At this time, this assay has not been approved by the FDA to exclude DVT/VTE. Results should be correlated with clinical presentation. Performed at [facility], [place] [address], [place], [place] [address]    Dg Abdomen Acute W/chest  Result Date: 02/04/2018 CLINICAL DATA:  Several weeks of abdominal pain radiating to back with nausea and vomiting. EXAM: DG ABDOMEN ACUTE W/ 1V CHEST COMPARISON:  None. FINDINGS: Chest radiograph demonstrates the lungs to be clear. Cardiomediastinal silhouette is within normal. Remainder of the chest radiograph is unremarkable. Abdominopelvic films demonstrate a nonobstructive bowel gas pattern. There is no focal mass or mass effect. No free peritoneal air. Surgical clips over the right upper quadrant. Minimal degenerate change of the spine. IMPRESSION:  Nonobstructive bowel gas pattern. No acute cardiopulmonary disease. Electronically Signed   By: Marin Olp M.D.   On: 02/04/2018 20:11    Pending Labs Unresulted Labs (From admission, onward)   Start     Ordered   02/04/18 1913  Troponin I  STAT,   STAT  02/04/18 1912   Signed and Held  HIV antibody (Routine Testing)  Once,   R     Signed and Held   Signed and Held  Hemoglobin A1c  Once,   R    Comments:  To assess prior glycemic control    Signed and Held   Signed and Held  CBC  (enoxaparin (LOVENOX)    CrCl >/= 30 ml/min)  Once,   R    Comments:  Baseline for enoxaparin therapy IF NOT ALREADY DRAWN.  Notify MD if PLT < 100 K.    Signed and Held   Signed and Held  Creatinine, serum  (enoxaparin (LOVENOX)    CrCl >/= 30 ml/min)  Once,   R    Comments:  Baseline for enoxaparin therapy IF NOT ALREADY DRAWN.    Signed and Held   Signed and Held  Creatinine, serum  (enoxaparin (LOVENOX)    CrCl >/= 30 ml/min)  Weekly,   R    Comments:  while on enoxaparin therapy    Signed and Held   Signed and Held  CBC  Tomorrow morning,   R     Signed and Held   Signed and Held  Comprehensive metabolic panel  Tomorrow morning,   R     Signed and Held   Signed and Held  Urine rapid drug screen (hosp performed)  STAT,   R     Signed and Held      Vitals/Pain Today's Vitals   02/04/18 1807 02/04/18 1830 02/04/18 1837 02/04/18 1900  BP: (!) 168/102 (!) 165/110 (!) 165/110 (!) 183/113  Pulse: (!) 114 (!) 123 (!) 123 (!) 123  Resp: 16  16   Temp:      TempSrc:      SpO2: 97% 93% 93% 95%  Weight:      Height:      PainSc:        Isolation Precautions No active isolations  Medications Medications  morphine 4 MG/ML injection 4 mg (4 mg Intravenous Given 02/04/18 1424)  ondansetron (ZOFRAN) injection 4 mg (4 mg Intravenous Given 02/04/18 1424)  sodium chloride 0.9 % bolus 1,000 mL (0 mLs Intravenous Stopped 02/04/18 1548)  morphine 4 MG/ML injection 4 mg (4 mg Intravenous Given 02/04/18  1548)  sodium chloride 0.9 % bolus 500 mL (0 mLs Intravenous Stopped 02/04/18 1728)  haloperidol lactate (HALDOL) injection 2.5 mg (2.5 mg Intravenous Given 02/04/18 1806)  sodium chloride 0.9 % bolus 1,000 mL (0 mLs Intravenous Stopped 02/04/18 2158)    Mobility walks

## 2018-02-04 NOTE — ED Triage Notes (Signed)
Pt reports that for several weeks having abd pain that radiates to her back with n/v. Pt was told that scar tissue, but denies having abd surgeries. Denies urinary problems.

## 2018-02-04 NOTE — ED Provider Notes (Signed)
[facility] DEPT Provider Note   CSN: [id number] Arrival date & time: 02/04/18  1335     History   Chief Complaint Chief Complaint  Patient presents with  . Abdominal Pain  . Emesis    HPI Candice Mccann is a 62 y.o. female.  Candice Mccann is a 62 y.o. Female with a history of colon cancer s/p resection, and chronic abdominal pain, who presents to the emergency department for evaluation of several weeks of lower abdominal pain that radiates to the left side of the lower back.  Patient reports she has had issues with this abdominal pain for several months and on review patient's chart it looks like this is been a problem since at least November 2018.  Patient reports over the past few months pain has been getting progressively worse, she was seen by her primary care doctor yesterday but given persisting pain she came here to the ED, she reports she has been seen at the ED and even 3 times in the last few weeks, but wanted to try coming here to get some answers.  She reports pain is constant, it is associated with nausea and several episodes of nonbilious nonbloody emesis.  She also reports diarrhea, denies any melena or hematochezia.  She reports pain today is most severe in the left lower quadrant and feels like it wraps around to her back, she also has history of issues with back pain.  Her doctor prescribed her tizanidine, she also reports she has been prescribed tramadol and Norco recently for this pain.     Past Medical History:  Diagnosis Date  . Cancer [facility])    colon cancer     There are no active problems to display for this patient.   Past Surgical History:  Procedure Laterality Date  . ABDOMINAL HYSTERECTOMY    . APPENDECTOMY    . BACK SURGERY    . HERNIA REPAIR       OB History   None      Home Medications    Prior to Admission medications   Not on File    Family History No family history on  file.  Social History Social History   Tobacco Use  . Smoking status: Current Every Day Smoker    Types: Cigarettes  . Smokeless tobacco: Never Used  Substance Use Topics  . Alcohol use: Not Currently  . Drug use: Not on file     Allergies   Nitroglycerin   Review of Systems Review of Systems  Constitutional: Negative for chills and fever.  HENT: Negative for congestion, rhinorrhea and sore throat.   Eyes: Negative for visual disturbance.  Respiratory: Negative for cough and shortness of breath.   Cardiovascular: Negative for chest pain.  Gastrointestinal: Positive for abdominal pain, diarrhea, nausea and vomiting. Negative for blood in stool.  Genitourinary: Negative for dysuria, flank pain and frequency.  Musculoskeletal: Positive for back pain. Negative for arthralgias and neck pain.  Skin: Negative for color change and rash.  Neurological: Negative for dizziness, syncope and light-headedness.     Physical Exam Updated Vital Signs BP (!) 184/104 (BP Location: Left Arm)   Pulse (!) 134   Temp 98.1 F (36.7 C) (Oral)   Resp (!) 22   Ht 5\' 2"  (1.575 m)   Wt 75.3 kg (166 lb)   SpO2 98%   BMI 30.36 kg/m   Physical Exam  Constitutional: She appears well-developed and well-nourished. No distress.  Patient  appears uncomfortable and in pain, but is in no acute distress.  HENT:  Head: Normocephalic and atraumatic.  Oropharynx is clear, mucous membranes appear slightly dry  Eyes: Right eye exhibits no discharge. Left eye exhibits no discharge.  Neck: Neck supple.  Cardiovascular: Regular rhythm, normal heart sounds and intact distal pulses.  Tachycardia  Pulmonary/Chest: Effort normal and breath sounds normal. No stridor. No respiratory distress. She has no wheezes. She has no rales ( ).  Respirations equal and unlabored, patient able to speak in full sentences, lungs clear to auscultation bilaterally  Abdominal: Soft. Bowel sounds are normal. She exhibits no  distension and no mass. There is tenderness. There is no rebound.  Abdomen is soft and nondistended, bowel sounds present throughout, there is tenderness over the left lower quadrant, with minimal guarding, all other quadrants nontender to palpation  Musculoskeletal:  There is some tenderness over the left lower back, no obvious palpable deformity  Neurological: She is alert. Coordination normal.  Skin: Skin is warm and dry. Capillary refill takes less than 2 seconds. She is not diaphoretic.  Psychiatric: She has a normal mood and affect. Her behavior is normal.  Nursing note and vitals reviewed.    ED Treatments / Results  Labs (all labs ordered are listed, but only abnormal results are displayed) Labs Reviewed  COMPREHENSIVE METABOLIC PANEL - Abnormal; Notable for the following components:      Result Value   Chloride 98 (*)    Glucose, Bld 280 (*)    Total Protein 8.7 (*)    All other components within normal limits  CBC - Abnormal; Notable for the following components:   Hemoglobin 15.3 (*)    All other components within normal limits  URINALYSIS, ROUTINE W REFLEX MICROSCOPIC - Abnormal; Notable for the following components:   Glucose, UA >=500 (*)    Hgb urine dipstick MODERATE (*)    Protein, ur 30 (*)    Bacteria, UA RARE (*)    All other components within normal limits  TROPONIN I - Abnormal; Notable for the following components:   Troponin I 0.03 (*)    All other components within normal limits  TROPONIN I - Abnormal; Notable for the following components:   Troponin I 0.03 (*)    All other components within normal limits  D-DIMER, QUANTITATIVE (NOT AT [facility]) - Abnormal; Notable for the following components:   D-Dimer, Quant 0.81 (*)    All other components within normal limits  GLUCOSE, CAPILLARY - Abnormal; Notable for the following components:   Glucose-Capillary 178 (*)    All other components within normal limits  I-STAT CG4 LACTIC ACID, ED - Abnormal; Notable  for the following components:   Lactic Acid, Venous 2.25 (*)    All other components within normal limits  LIPASE, BLOOD  I-STAT CG4 LACTIC ACID, ED    EKG EKG Interpretation  Date/Time:  Saturday February 04 2018 15:41:02 EDT Ventricular Rate:  111 PR Interval:    QRS Duration: 96 QT Interval:  349 QTC Calculation: 475 R Axis:   60 Text Interpretation:  Sinus tachycardia Baseline wander in lead(s) V2 Confirmed by Quintella Reichert [phone]) on 02/04/2018 5:11:32 PM   Radiology Dg Abdomen Acute W/chest  Result Date: 02/04/2018 CLINICAL DATA:  Several weeks of abdominal pain radiating to back with nausea and vomiting. EXAM: DG ABDOMEN ACUTE W/ 1V CHEST COMPARISON:  None. FINDINGS: Chest radiograph demonstrates the lungs to be clear. Cardiomediastinal silhouette is within normal. Remainder of the chest radiograph  is unremarkable. Abdominopelvic films demonstrate a nonobstructive bowel gas pattern. There is no focal mass or mass effect. No free peritoneal air. Surgical clips over the right upper quadrant. Minimal degenerate change of the spine. IMPRESSION: Nonobstructive bowel gas pattern. No acute cardiopulmonary disease. Electronically Signed   By: Marin Olp M.D.   On: 02/04/2018 20:11    Procedures Procedures (including critical care time)  Medications Ordered in ED Medications  morphine 4 MG/ML injection 4 mg (4 mg Intravenous Given 02/04/18 1424)  ondansetron (ZOFRAN) injection 4 mg (4 mg Intravenous Given 02/04/18 1424)  sodium chloride 0.9 % bolus 1,000 mL (0 mLs Intravenous Stopped 02/04/18 1548)  morphine 4 MG/ML injection 4 mg (4 mg Intravenous Given 02/04/18 1548)  sodium chloride 0.9 % bolus 500 mL (0 mLs Intravenous Stopped 02/04/18 1728)  haloperidol lactate (HALDOL) injection 2.5 mg (2.5 mg Intravenous Given 02/04/18 1806)  sodium chloride 0.9 % bolus 1,000 mL (0 mLs Intravenous Stopped 02/04/18 2158)     Initial Impression / Assessment and Plan / ED Course  I have reviewed the  triage vital signs and the nursing notes.  Pertinent labs & imaging results that were available during my care of the patient were reviewed by me and considered in my medical decision making (see chart for details).  Patient presents to the ED for several weeks of worsening left lower quadrant abdominal pain, patient also reports some left-sided low back pain.  She reports associated nausea and vomiting, denies hematemesis, hematochezia or melena.  Does report some intermittent loose stools.  No urinary symptoms.  Patient denies fevers or chills.  On review of patient's chart she has a long history of similar abdominal pain, has been seen recently at [facility] the ED with recent normal noncontrasted abdominal CT.  Patient was seen by her primary care doctor yesterday who recommended she follow-up with her general surgeon.  Patient has been taking hydrocodone without improvement.  On arrival patient is tachycardic to 134, tachypneic at 22 and hypertensive at 184/104, she appears uncomfortable and in pain, patient also appears slightly dry.  There is mild tenderness in the left lower quadrant over the left lower back.  Will get abdominal labs.  Fluid bolus, morphine and Zofran for symptomatic treatment.  Patient discussed with Dr. Ralene Bathe who evaluated the patient as well, will hold off on abdominal imaging at this time awaiting laboratory evaluation.  Labs are overall reassuring, no leukocytosis, hemoglobin of 15.3, glucose of 280, no other electrolyte derangements, patient has known history of diabetes, normal renal function, normal liver function, normal lipase.  Urinalysis with some protein, but no evidence of infection.  Despite reassuring labs patient reports persistent pain.  She is also persistently tachycardic although this is slightly improved to the 110s.  Discussed again with Dr. Ralene Bathe who recommends adding on EKG, troponin and lactic acid.  Had long discussion with patient and family do not feel that  repeat CT scan of the abdomen at this time would be beneficial as there has not been an acute change in her pain just slow progressive worsening.  Lactic acid initially mildly positive at 2.25, this could easily be due to dehydration, will give additional fluids and recheck to ensure the lactic clears.  EKG without concerning ischemic changes continues to show sinus tachycardia.  Troponin is pending.  Patient continues to complain of abdominal pain despite 2 doses of morphine, will give Haldol.  Troponin is 0.03 which is just slightly above normal.  Patient is denying any chest  pain or shortness of breath.  Despite rehydration patient is persistently tachycardic which is now increasing to the 120s.  We will get acute abdomen and chest.  Because of patient's contrast allergy unable to get CT NGO of the chest or abdomen to assess for PE or dissection, although presentation is not exactly typical of either of these.  Acute abdominal series shows no evidence of obstructive bowel pattern or acute cardiopulmonary disease.  Repeat troponin is pending.  Given persistent tachycardia and pain not feel would be safe to send patient home we will consult hospitalist for admission.  Spoke with Dr. Jonelle Sidle who will see and evaluate the patient, requests adding on d-dimer, if positive he will proceed with VQ scan.  Final Clinical Impressions(s) / ED Diagnoses   Final diagnoses:  Right lower quadrant abdominal pain  Sinus tachycardia  Left lower quadrant pain  Nausea vomiting and diarrhea  Chronic left-sided low back pain without sciatica  Dehydration    ED Discharge Orders    None       Janet Berlin 02/04/18 2359    Quintella Reichert, MD 02/07/18 [id number]

## 2018-02-04 NOTE — ED Notes (Signed)
MD AND RN NOTIFIED OF PATIENT'S LACTIC ACID LEVEL OF 2.25

## 2018-02-05 ENCOUNTER — Other Ambulatory Visit: Payer: Self-pay

## 2018-02-05 ENCOUNTER — Inpatient Hospital Stay (HOSPITAL_COMMUNITY): Payer: Medicare Other

## 2018-02-05 DIAGNOSIS — R1032 Left lower quadrant pain: Secondary | ICD-10-CM

## 2018-02-05 DIAGNOSIS — I10 Essential (primary) hypertension: Secondary | ICD-10-CM

## 2018-02-05 LAB — CBC
HCT: 41.5 % (ref 36.0–46.0)
Hemoglobin: 13.9 g/dL (ref 12.0–15.0)
MCH: 29.6 pg (ref 26.0–34.0)
MCHC: 33.5 g/dL (ref 30.0–36.0)
MCV: 88.3 fL (ref 78.0–100.0)
PLATELETS: 315 10*3/uL (ref 150–400)
RBC: 4.7 MIL/uL (ref 3.87–5.11)
RDW: 12.9 % (ref 11.5–15.5)
WBC: 9.2 10*3/uL (ref 4.0–10.5)

## 2018-02-05 LAB — RAPID URINE DRUG SCREEN, HOSP PERFORMED
AMPHETAMINES: NOT DETECTED
BARBITURATES: NOT DETECTED
Benzodiazepines: POSITIVE — AB
COCAINE: NOT DETECTED
OPIATES: POSITIVE — AB
TETRAHYDROCANNABINOL: NOT DETECTED

## 2018-02-05 LAB — COMPREHENSIVE METABOLIC PANEL
ALT: 18 U/L (ref 14–54)
AST: 17 U/L (ref 15–41)
Albumin: 4 g/dL (ref 3.5–5.0)
Alkaline Phosphatase: 92 U/L (ref 38–126)
Anion gap: 12 (ref 5–15)
BUN: 9 mg/dL (ref 6–20)
CHLORIDE: 98 mmol/L — AB (ref 101–111)
CO2: 27 mmol/L (ref 22–32)
CREATININE: 0.64 mg/dL (ref 0.44–1.00)
Calcium: 8.9 mg/dL (ref 8.9–10.3)
Glucose, Bld: 223 mg/dL — ABNORMAL HIGH (ref 65–99)
POTASSIUM: 3.9 mmol/L (ref 3.5–5.1)
SODIUM: 137 mmol/L (ref 135–145)
Total Bilirubin: 1 mg/dL (ref 0.3–1.2)
Total Protein: 8.1 g/dL (ref 6.5–8.1)

## 2018-02-05 LAB — GLUCOSE, CAPILLARY
Glucose-Capillary: 268 mg/dL — ABNORMAL HIGH (ref 65–99)
Glucose-Capillary: 235 mg/dL — ABNORMAL HIGH (ref 65–99)
Glucose-Capillary: 205 mg/dL — ABNORMAL HIGH (ref 65–99)
Glucose-Capillary: 189 mg/dL — ABNORMAL HIGH (ref 65–99)

## 2018-02-05 LAB — HEMOGLOBIN A1C
HEMOGLOBIN A1C: 9.5 % — AB (ref 4.8–5.6)
Mean Plasma Glucose: 225.95 mg/dL

## 2018-02-05 LAB — TROPONIN I: Troponin I: 0.03 ng/mL (ref <0.03)

## 2018-02-05 LAB — HIV ANTIBODY (ROUTINE TESTING W REFLEX): HIV SCREEN 4TH GENERATION: NONREACTIVE

## 2018-02-05 MED ORDER — POLYETHYLENE GLYCOL 3350 17 G PO PACK
17.0000 g | PACK | Freq: Every day | ORAL | Status: DC
  Administered 2018-02-05 – 2018-02-07 (×3): 17 g via ORAL
  Filled 2018-02-05 (×4): qty 1

## 2018-02-05 MED ORDER — AMLODIPINE BESYLATE 5 MG PO TABS
5.0000 mg | ORAL_TABLET | Freq: Every day | ORAL | Status: DC
  Administered 2018-02-05 – 2018-02-08 (×4): 5 mg via ORAL
  Filled 2018-02-05 (×4): qty 1

## 2018-02-05 MED ORDER — SODIUM CHLORIDE 0.9 % IV SOLN
INTRAVENOUS | Status: AC
  Administered 2018-02-05 (×2): via INTRAVENOUS

## 2018-02-05 MED ORDER — LABETALOL HCL 5 MG/ML IV SOLN
10.0000 mg | Freq: Once | INTRAVENOUS | Status: AC
  Administered 2018-02-05: 10 mg via INTRAVENOUS
  Filled 2018-02-05: qty 4

## 2018-02-05 MED ORDER — ATORVASTATIN CALCIUM 20 MG PO TABS
20.0000 mg | ORAL_TABLET | Freq: Every day | ORAL | Status: DC
  Administered 2018-02-06 – 2018-02-07 (×2): 20 mg via ORAL
  Filled 2018-02-05 (×2): qty 1

## 2018-02-05 MED ORDER — HYDRALAZINE HCL 20 MG/ML IJ SOLN
10.0000 mg | INTRAMUSCULAR | Status: DC | PRN
  Administered 2018-02-05 – 2018-02-06 (×2): 10 mg via INTRAVENOUS
  Filled 2018-02-05 (×2): qty 1

## 2018-02-05 MED ORDER — HYDRALAZINE HCL 20 MG/ML IJ SOLN
5.0000 mg | Freq: Four times a day (QID) | INTRAMUSCULAR | Status: DC | PRN
  Administered 2018-02-05: 5 mg via INTRAVENOUS
  Filled 2018-02-05: qty 1

## 2018-02-05 NOTE — Progress Notes (Signed)
Georges Mouse notified regarding pt's elevated blood pressure despite pain medication administration and relaxation techniques encouraged.  Will continue to monitor pt closely and carry out any new orders. Carnella [place] I

## 2018-02-05 NOTE — Progress Notes (Signed)
Pharmacy Brief Note  FDA warning states to limit simvastatin to 20mg /day when used with amlodipine.  Patients on amlodipine and simvastatin >20 mg/day have reported cases of rhabdomyolysis.   Atorvastatin (Lipitor) 20mg  was substituted for simvastatin 40mg  per P&T policy.    Please consider at discharge.  Thanks, Pharmacy.  Gretta Arab PharmD, BCPS Pager [phone] 02/05/2018 6:47 PM

## 2018-02-05 NOTE — Progress Notes (Signed)
[facility] PROGRESS NOTE    Progress Note  Candice Mccann  ACZ:[id number] DOB: 01/15/1956 DOA: 02/04/2018 PCP: Celedonio Savage, MD     Brief Narrative:   Candice Mccann is an 62 y.o. female past medical history significant of diabetes, colon cancer status post resection degenerative disc disease status post surgery, essential hypertension who presents to the ED with left flank pain radiating to her abdomen with nausea and vomiting her UDS was positive for opiates and UDS, lactic acid on admission was 2.5 repeated 2 hours after that is 1.6 cardiac biomarkers have remained flat she has no leukocytosis  Assessment/Plan:   Sinus tachycardia Dimer was positive VQ scan is pending. Organophosphate toxicity 10 to cause bradycardia's elevation and increased sweating. Resume metoprolol. She denies any chest pain or shortness of breath.  Lead EKG shows sinus tachycardia.  Recurrent abdominal pain: Work-up so far negative, she relates of left lower quadrant pain. MRI of the back is pending.  Degenerative disc disease: MRI is pending continue narcotics for pain.  Diabetes mellitus type 2: Continue to hold metformin agree with sliding scale insulin.  Anxiety disorder: Continue current home regimen.  Essential hypertension: Blood pressure seems to be stable continue current management.   DVT prophylaxis: lovenox Family Communication:husband Disposition Plan/Barrier to D/C: unable to determine Code Status:     Code Status Orders  (From admission, onward)        Start     Ordered   02/04/18 2230  Full code  Continuous     02/04/18 2229    Code Status History    This patient has a current code status but no historical code status.        IV Access:    Peripheral IV   Procedures and diagnostic studies:   Dg Abdomen Acute W/chest  Result Date: 02/04/2018 CLINICAL DATA:  Several weeks of abdominal pain radiating to back with nausea and vomiting.  EXAM: DG ABDOMEN ACUTE W/ 1V CHEST COMPARISON:  None. FINDINGS: Chest radiograph demonstrates the lungs to be clear. Cardiomediastinal silhouette is within normal. Remainder of the chest radiograph is unremarkable. Abdominopelvic films demonstrate a nonobstructive bowel gas pattern. There is no focal mass or mass effect. No free peritoneal air. Surgical clips over the right upper quadrant. Minimal degenerate change of the spine. IMPRESSION: Nonobstructive bowel gas pattern. No acute cardiopulmonary disease. Electronically Signed   By: Marin Olp M.D.   On: 02/04/2018 20:11     Medical Consultants:    None.  Anti-Infectives:   *None  Subjective:    Candice Mccann no complaints feels great.  Objective:    Vitals:   02/04/18 2234 02/04/18 2333 02/05/18 0436 02/05/18 0540  BP:  (!) 203/117 (!) 204/118 (!) 171/101  Pulse:  (!) 124 (!) 120 (!) 105  Resp:  20 20   Temp:  98.3 F (36.8 C) 98.7 F (37.1 C)   TempSrc:  Oral Oral   SpO2:  98% 98%   Weight: 75.1 kg (165 lb 9.1 oz)     Height: 5\' 2"  (1.575 m)       Intake/Output Summary (Last 24 hours) at 02/05/2018 0849 Last data filed at 02/05/2018 0200 Gross per 24 hour  Intake 1763.33 ml  Output 200 ml  Net 1563.33 ml   Filed Weights   02/04/18 1347 02/04/18 2234  Weight: 75.3 kg (166 lb) 75.1 kg (165 lb 9.1 oz)    Exam: General exam: In no acute distress. Respiratory system:  Good air movement and clear to auscultation. Cardiovascular system: S1 & S2 heard, RRR.  Gastrointestinal system: Abdomen is nondistended, soft and nontender.  Central nervous system: Alert and oriented. No focal neurological deficits. Extremities: No pedal edema. Skin: No rashes, lesions or ulcers Psychiatry: Judgement and insight appear normal. Mood & affect appropriate.    Data Reviewed:    Labs: Basic Metabolic Panel: Recent Labs  Lab 02/04/18 1351 02/05/18 0445  NA 137 137  K 4.2 3.9  CL 98* 98*  CO2 26 27  GLUCOSE 280*  223*  BUN 15 9  CREATININE 0.73 0.64  CALCIUM 9.4 8.9   GFR Estimated Creatinine Clearance: 69.2 mL/min (by C-G formula based on SCr of 0.64 mg/dL). Liver Function Tests: Recent Labs  Lab 02/04/18 1351 02/05/18 0445  AST 23 17  ALT 20 18  ALKPHOS 98 92  BILITOT 0.8 1.0  PROT 8.7* 8.1  ALBUMIN 4.5 4.0   Recent Labs  Lab 02/04/18 1351  LIPASE 26   No results for input(s): AMMONIA in the last 168 hours. Coagulation profile No results for input(s): INR, PROTIME in the last 168 hours.  CBC: Recent Labs  Lab 02/04/18 1351 02/05/18 0445  WBC 7.9 9.2  HGB 15.3* 13.9  HCT 45.1 41.5  MCV 90.4 88.3  PLT 312 315   Cardiac Enzymes: Recent Labs  Lab 02/04/18 1522 02/04/18 1936  TROPONINI 0.03* 0.03*   BNP (last 3 results) No results for input(s): PROBNP in the last 8760 hours. CBG: Recent Labs  Lab 02/04/18 2247 02/05/18 0746  GLUCAP 178* 268*   D-Dimer: Recent Labs    02/04/18 2031  DDIMER 0.81*   Hgb A1c: No results for input(s): HGBA1C in the last 72 hours. Lipid Profile: No results for input(s): CHOL, HDL, LDLCALC, TRIG, CHOLHDL, LDLDIRECT in the last 72 hours. Thyroid function studies: No results for input(s): TSH, T4TOTAL, T3FREE, THYROIDAB in the last 72 hours.  Invalid input(s): FREET3 Anemia work up: No results for input(s): VITAMINB12, FOLATE, FERRITIN, TIBC, IRON, RETICCTPCT in the last 72 hours. Sepsis Labs: Recent Labs  Lab 02/04/18 1351 02/04/18 1553 02/04/18 1814 02/05/18 0445  WBC 7.9  --   --  9.2  LATICACIDVEN  --  2.25* 1.67  --    Microbiology No results found for this or any previous visit (from the past 240 hour(s)).   Medications:   . aspirin  325 mg Oral Daily  . cholecalciferol  1,000 Units Oral Daily  . cloNIDine  0.1 mg Oral Daily  . diphenhydrAMINE  50 mg Oral QHS  . enoxaparin (LOVENOX) injection  40 mg Subcutaneous QHS  . glipiZIDE  10 mg Oral BID AC  . hydrochlorothiazide  12.5 mg Oral Daily  .  HYDROcodone-acetaminophen  1 tablet Oral Q4H  . insulin aspart  0-9 Units Subcutaneous TID WC  . lisinopril  20 mg Oral Daily  . metoprolol tartrate  25 mg Oral Daily  . nicotine  21 mg Transdermal Daily  . potassium chloride SA  20 mEq Oral Daily  . simvastatin  40 mg Oral q1800  . tiZANidine  4 mg Oral QHS  . vitamin B-12  1,000 mcg Oral Daily   Continuous Infusions: . sodium chloride 100 mL/hr at 02/04/18 2322     LOS: 1 day   [facility] Pager [phone]  *Please refer to [email or web address], password [email or web address] to get updated schedule on who will round on this patient, as hospitalists switch teams weekly. If 7PM-7AM, please  contact night-coverage at [email or web address], password [email or web address] for any overnight needs.  02/05/2018, 8:49 AM

## 2018-02-06 ENCOUNTER — Inpatient Hospital Stay (HOSPITAL_COMMUNITY): Payer: Medicare Other

## 2018-02-06 ENCOUNTER — Encounter (HOSPITAL_COMMUNITY): Payer: Self-pay | Admitting: Radiology

## 2018-02-06 LAB — GLUCOSE, CAPILLARY
GLUCOSE-CAPILLARY: 101 mg/dL — AB (ref 65–99)
Glucose-Capillary: 330 mg/dL — ABNORMAL HIGH (ref 65–99)
Glucose-Capillary: 343 mg/dL — ABNORMAL HIGH (ref 65–99)
Glucose-Capillary: 208 mg/dL — ABNORMAL HIGH (ref 65–99)
Glucose-Capillary: 132 mg/dL — ABNORMAL HIGH (ref 65–99)

## 2018-02-06 MED ORDER — METOPROLOL TARTRATE 5 MG/5ML IV SOLN: 5.0000 mg | Freq: Once | INTRAVENOUS | Status: DC

## 2018-02-06 MED ORDER — INSULIN DETEMIR 100 UNIT/ML ~~LOC~~ SOLN
5.0000 [IU] | Freq: Two times a day (BID) | SUBCUTANEOUS | Status: DC
  Administered 2018-02-06: 5 [IU] via SUBCUTANEOUS
  Filled 2018-02-06 (×2): qty 0.05

## 2018-02-06 MED ORDER — METOPROLOL TARTRATE 5 MG/5ML IV SOLN
5.0000 mg | INTRAVENOUS | Status: DC | PRN
  Administered 2018-02-08: 5 mg via INTRAVENOUS
  Filled 2018-02-06: qty 5

## 2018-02-06 MED ORDER — METOPROLOL TARTRATE 5 MG/5ML IV SOLN
5.0000 mg | Freq: Four times a day (QID) | INTRAVENOUS | Status: DC | PRN
  Administered 2018-02-06: 5 mg via INTRAVENOUS
  Filled 2018-02-06: qty 5

## 2018-02-06 MED ORDER — INSULIN DETEMIR 100 UNIT/ML ~~LOC~~ SOLN
10.0000 [IU] | Freq: Two times a day (BID) | SUBCUTANEOUS | Status: DC
Start: 2018-02-06 — End: 2018-02-08
  Administered 2018-02-06 – 2018-02-08 (×4): 10 [IU] via SUBCUTANEOUS
  Filled 2018-02-06 (×5): qty 0.1

## 2018-02-06 MED ORDER — INSULIN ASPART 100 UNIT/ML ~~LOC~~ SOLN: 0.0000 [IU] | Freq: Every day | SUBCUTANEOUS | Status: DC

## 2018-02-06 MED ORDER — INSULIN ASPART 100 UNIT/ML ~~LOC~~ SOLN
0.0000 [IU] | Freq: Three times a day (TID) | SUBCUTANEOUS | Status: DC
  Administered 2018-02-06: 11 [IU] via SUBCUTANEOUS
  Administered 2018-02-06: 2 [IU] via SUBCUTANEOUS
  Administered 2018-02-07: 5 [IU] via SUBCUTANEOUS
  Administered 2018-02-07: 8 [IU] via SUBCUTANEOUS
  Administered 2018-02-08: 3 [IU] via SUBCUTANEOUS
  Administered 2018-02-08: 8 [IU] via SUBCUTANEOUS

## 2018-02-06 MED ORDER — TECHNETIUM TO 99M ALBUMIN AGGREGATED
3.9000 | Freq: Once | INTRAVENOUS | Status: AC | PRN
  Administered 2018-02-06: 3.9 via INTRAVENOUS

## 2018-02-06 MED ORDER — HYDROCODONE-ACETAMINOPHEN 10-325 MG PO TABS
2.0000 | ORAL_TABLET | ORAL | Status: DC
  Administered 2018-02-06 – 2018-02-08 (×12): 2 via ORAL
  Filled 2018-02-06 (×13): qty 2

## 2018-02-06 MED ORDER — PANTOPRAZOLE SODIUM 40 MG PO TBEC
40.0000 mg | DELAYED_RELEASE_TABLET | Freq: Two times a day (BID) | ORAL | Status: DC
  Administered 2018-02-06 – 2018-02-08 (×5): 40 mg via ORAL
  Filled 2018-02-06 (×5): qty 1

## 2018-02-06 MED ORDER — INSULIN ASPART 100 UNIT/ML ~~LOC~~ SOLN
3.0000 [IU] | Freq: Three times a day (TID) | SUBCUTANEOUS | Status: DC
  Administered 2018-02-06 – 2018-02-08 (×7): 3 [IU] via SUBCUTANEOUS

## 2018-02-06 MED ORDER — METOPROLOL TARTRATE 50 MG PO TABS
50.0000 mg | ORAL_TABLET | Freq: Every day | ORAL | Status: DC
  Administered 2018-02-06: 50 mg via ORAL
  Filled 2018-02-06: qty 1

## 2018-02-06 MED ORDER — MORPHINE SULFATE (PF) 4 MG/ML IV SOLN: 4.0000 mg | INTRAVENOUS | Status: DC | PRN

## 2018-02-06 MED ORDER — METOCLOPRAMIDE HCL 5 MG/ML IJ SOLN
5.0000 mg | Freq: Three times a day (TID) | INTRAMUSCULAR | Status: DC
  Administered 2018-02-06 – 2018-02-08 (×7): 5 mg via INTRAVENOUS
  Filled 2018-02-06 (×7): qty 2

## 2018-02-06 MED ORDER — LISINOPRIL-HYDROCHLOROTHIAZIDE 20-12.5 MG PO TABS: 1.0000 | ORAL_TABLET | Freq: Every day | ORAL | Status: DC

## 2018-02-06 MED ORDER — TECHNETIUM TC 99M DIETHYLENETRIAME-PENTAACETIC ACID
31.6000 | Freq: Once | INTRAVENOUS | Status: AC | PRN
  Administered 2018-02-06: 31.6 via INTRAVENOUS

## 2018-02-06 NOTE — Progress Notes (Signed)
[facility] PROGRESS NOTE    Progress Note  Candice Mccann  ALP:[id number] DOB: November 15, 1955 DOA: 02/04/2018 PCP: Celedonio Savage, MD     Brief Narrative:   Candice Mccann is an 62 y.o. female past medical history significant of diabetes, colon cancer status post resection degenerative disc disease status post surgery, essential hypertension who presents to the ED with left flank pain radiating to her abdomen with nausea and vomiting her UDS was positive for opiates and UDS, lactic acid on admission was 2.5 repeated 2 hours after that is 1.6 cardiac biomarkers have remained flat she has no leukocytosis  Assessment/Plan:   Sinus tachycardia Dimer was positive VQ scan showed no signs of PE Increase metoprolol She denies any chest pain or shortness of breath.  Lead EKG shows sinus tachycardia. Had no events on telemetry.  Recurrent abdominal pain: Work-up so far negative, she relates of left lower quadrant pain. MRI of the back is mild disc bulging. She has some nausea this morning getting a CT scan of the abdomen and pelvis. Physical exam today is benign start on Protonix p.o. twice daily. Zofran for nausea.  Degenerative disc disease: MRI is no new acute findings continue narcotics for pain.  Diabetes mellitus type 2: Continue to hold metformin agree with sliding scale insulin. Tolerating her diet until this morning.  Anxiety disorder: Continue current home regimen.  Essential hypertension: Blood pressure seems to be stable continue current management.   DVT prophylaxis: lovenox Family Communication:husband Disposition Plan/Barrier to D/C: unable to determine Code Status:     Code Status Orders  (From admission, onward)        Start     Ordered   02/04/18 2230  Full code  Continuous     02/04/18 2229    Code Status History    This patient has a current code status but no historical code status.        IV Access:    Peripheral  IV   Procedures and diagnostic studies:   Mr Lumbar Spine Wo Contrast  Result Date: 02/05/2018 CLINICAL DATA:  Radiculopathy, greater than 6 weeks conservative treatment, persistent symptoms. Abdominal pain with nausea. Radicular etiology suspected. EXAM: MRI LUMBAR SPINE WITHOUT CONTRAST TECHNIQUE: Multiplanar, multisequence MR imaging of the lumbar spine was performed. No intravenous contrast was administered. COMPARISON:  CT abdomen and pelvis 01/27/2018. FINDINGS: Segmentation: 5 non rib-bearing lumbar type vertebral bodies are present. The lowest fully formed vertebral body is L5. Alignment: AP alignment is anatomic. Mild rightward curvature is centered at L1. Vertebrae:  Marrow signal and vertebral body heights are normal. Conus medullaris and cauda equina: Conus extends to the L1 level. Conus and cauda equina appear normal. Paraspinal and other soft tissues: Limited imaging of the abdomen is unremarkable. There is no significant adenopathy. Disc levels: T12-L1: Minimal disc bulging is present. There is no focal stenosis. L1-2: Negative. L2-3: Negative. L3-4: Mild disc bulging is present. Moderate facet hypertrophy is noted bilaterally. This results in mild subarticular and foraminal narrowing bilaterally. L4-5: A leftward broad-based disc protrusion is present. Moderate facet hypertrophy is noted. This results in mild subarticular and foraminal narrowing bilaterally. L5-S1: A leftward disc protrusion is present. Moderate facet hypertrophy is noted. This results an moderate left and mild right subarticular and foraminal stenosis. IMPRESSION: 1. Mild subarticular and foraminal narrowing bilaterally at L3-4 and L4-5 secondary to disc bulging and facet hypertrophy. 2. Moderate left and mild right subarticular and foraminal narrowing at L5-S1 secondary to a leftward  disc protrusion in combination with moderate facet hypertrophy bilaterally. Electronically Signed   By: San Morelle M.D.   On:  02/05/2018 21:04   Dg Abdomen Acute W/chest  Result Date: 02/04/2018 CLINICAL DATA:  Several weeks of abdominal pain radiating to back with nausea and vomiting. EXAM: DG ABDOMEN ACUTE W/ 1V CHEST COMPARISON:  None. FINDINGS: Chest radiograph demonstrates the lungs to be clear. Cardiomediastinal silhouette is within normal. Remainder of the chest radiograph is unremarkable. Abdominopelvic films demonstrate a nonobstructive bowel gas pattern. There is no focal mass or mass effect. No free peritoneal air. Surgical clips over the right upper quadrant. Minimal degenerate change of the spine. IMPRESSION: Nonobstructive bowel gas pattern. No acute cardiopulmonary disease. Electronically Signed   By: Marin Olp M.D.   On: 02/04/2018 20:11     Medical Consultants:    None.  Anti-Infectives:   *None  Subjective:    Candice Mccann abdominal pain is resolved she had an episode of vomiting this morning.  She denies any further abdominal pain  Objective:    Vitals:   02/05/18 1923 02/05/18 2108 02/06/18 0320 02/06/18 0446  BP: (!) 184/113 (!) 175/90 (!) 163/101 (!) 176/121  Pulse:  (!) 115 (!) 135 (!) 134  Resp:  20 20 18   Temp:  98.5 F (36.9 C) 98.6 F (37 C) 98.5 F (36.9 C)  TempSrc:  Oral Oral Oral  SpO2:  100% 100% 99%  Weight:      Height:        Intake/Output Summary (Last 24 hours) at 02/06/2018 0808 Last data filed at 02/06/2018 0600 Gross per 24 hour  Intake 2810 ml  Output -  Net 2810 ml   Filed Weights   02/04/18 1347 02/04/18 2234  Weight: 75.3 kg (166 lb) 75.1 kg (165 lb 9.1 oz)    Exam: General exam: In no acute distress. Respiratory system: Good air movement and clear to auscultation. Cardiovascular system: S1 & S2 heard, RRR.  Gastrointestinal system: Positive bowel sounds soft nontender nondistended. Central nervous system: Alert and oriented. No focal neurological deficits. Extremities: No pedal edema. Skin: No rashes, lesions or  ulcers Psychiatry: Judgement and insight appear normal. Mood & affect appropriate.    Data Reviewed:    Labs: Basic Metabolic Panel: Recent Labs  Lab 02/04/18 1351 02/05/18 0445  NA 137 137  K 4.2 3.9  CL 98* 98*  CO2 26 27  GLUCOSE 280* 223*  BUN 15 9  CREATININE 0.73 0.64  CALCIUM 9.4 8.9   GFR Estimated Creatinine Clearance: 69.2 mL/min (by C-G formula based on SCr of 0.64 mg/dL). Liver Function Tests: Recent Labs  Lab 02/04/18 1351 02/05/18 0445  AST 23 17  ALT 20 18  ALKPHOS 98 92  BILITOT 0.8 1.0  PROT 8.7* 8.1  ALBUMIN 4.5 4.0   Recent Labs  Lab 02/04/18 1351  LIPASE 26   No results for input(s): AMMONIA in the last 168 hours. Coagulation profile No results for input(s): INR, PROTIME in the last 168 hours.  CBC: Recent Labs  Lab 02/04/18 1351 02/05/18 0445  WBC 7.9 9.2  HGB 15.3* 13.9  HCT 45.1 41.5  MCV 90.4 88.3  PLT 312 315   Cardiac Enzymes: Recent Labs  Lab 02/04/18 1522 02/04/18 1936  TROPONINI 0.03* 0.03*   BNP (last 3 results) No results for input(s): PROBNP in the last 8760 hours. CBG: Recent Labs  Lab 02/05/18 0746 02/05/18 1131 02/05/18 1636 02/05/18 2105 02/06/18 0735  GLUCAP 268* 235*  205* 189* 330*   D-Dimer: Recent Labs    02/04/18 2031  DDIMER 0.81*   Hgb A1c: Recent Labs    02/05/18 0445  HGBA1C 9.5*   Lipid Profile: No results for input(s): CHOL, HDL, LDLCALC, TRIG, CHOLHDL, LDLDIRECT in the last 72 hours. Thyroid function studies: No results for input(s): TSH, T4TOTAL, T3FREE, THYROIDAB in the last 72 hours.  Invalid input(s): FREET3 Anemia work up: No results for input(s): VITAMINB12, FOLATE, FERRITIN, TIBC, IRON, RETICCTPCT in the last 72 hours. Sepsis Labs: Recent Labs  Lab 02/04/18 1351 02/04/18 1553 02/04/18 1814 02/05/18 0445  WBC 7.9  --   --  9.2  LATICACIDVEN  --  2.25* 1.67  --    Microbiology No results found for this or any previous visit (from the past 240  hour(s)).   Medications:   . amLODipine  5 mg Oral Daily  . aspirin  325 mg Oral Daily  . atorvastatin  20 mg Oral q1800  . cholecalciferol  1,000 Units Oral Daily  . cloNIDine  0.1 mg Oral Daily  . diphenhydrAMINE  50 mg Oral QHS  . enoxaparin (LOVENOX) injection  40 mg Subcutaneous QHS  . glipiZIDE  10 mg Oral BID AC  . hydrochlorothiazide  12.5 mg Oral Daily  . HYDROcodone-acetaminophen  1 tablet Oral Q4H  . insulin aspart  0-9 Units Subcutaneous TID WC  . lisinopril  20 mg Oral Daily  . metoprolol tartrate  50 mg Oral Daily  . nicotine  21 mg Transdermal Daily  . polyethylene glycol  17 g Oral Daily  . potassium chloride SA  20 mEq Oral Daily  . tiZANidine  4 mg Oral QHS  . vitamin B-12  1,000 mcg Oral Daily   Continuous Infusions: . sodium chloride 75 mL/hr at 02/05/18 2356     LOS: 2 days   Charlynne Cousins  [facility] Pager [phone]  *Please refer to [email or web address], password [email or web address] to get updated schedule on who will round on this patient, as hospitalists switch teams weekly. If 7PM-7AM, please contact night-coverage at [email or web address], password [email or web address] for any overnight needs.  02/06/2018, 8:08 AM

## 2018-02-06 NOTE — Care Management Important Message (Signed)
Important Message  Patient Details  Name: Candice Mccann MRN: [id number] Date of Birth: December 13, 1955   Medicare Important Message Given:  Yes    Kerin Salen 02/06/2018, 2:43 [place] Message  Patient Details  Name: Candice Mccann MRN: [id number] Date of Birth: Jul 17, 1956   Medicare Important Message Given:  Yes    Kerin Salen 02/06/2018, 2:43 PM

## 2018-02-06 NOTE — Progress Notes (Signed)
Patient had a large, soft BM.

## 2018-02-06 NOTE — Progress Notes (Signed)
PT Cancellation Note  Patient Details Name: Candice Mccann MRN: [id number] DOB: 12-19-1955   Cancelled Treatment:    Reason Eval/Treat Not Completed: PT screened, no needs identified, will sign off. Spoke with pt who denied need for PT services. Will sign off at pt's request.    Weston Anna, MPT Pager: [phone]

## 2018-02-06 NOTE — Progress Notes (Signed)
Patient states she felt dizzy and her 'head is swimming'.  Patient BP 99/69, HR 97, CBG 208.  Educated patient to be very careful when getting up and to call if she needs to use the bathroom or get out of bed.  Patient verbalized understanding of this.  Dr. Olevia Bowens paged, will continue to monitor.

## 2018-02-07 ENCOUNTER — Encounter (HOSPITAL_COMMUNITY): Payer: Self-pay | Admitting: Gastroenterology

## 2018-02-07 LAB — GLUCOSE, CAPILLARY
GLUCOSE-CAPILLARY: 113 mg/dL — AB (ref 65–99)
Glucose-Capillary: 289 mg/dL — ABNORMAL HIGH (ref 65–99)
Glucose-Capillary: 280 mg/dL — ABNORMAL HIGH (ref 65–99)

## 2018-02-07 MED ORDER — PROCHLORPERAZINE EDISYLATE 10 MG/2ML IJ SOLN
5.0000 mg | Freq: Once | INTRAMUSCULAR | Status: AC
  Administered 2018-02-07: 5 mg via INTRAVENOUS
  Filled 2018-02-07: qty 2

## 2018-02-07 MED ORDER — SODIUM CHLORIDE 0.9 % IV BOLUS
500.0000 mL | Freq: Once | INTRAVENOUS | Status: AC
  Administered 2018-02-07: 500 mL via INTRAVENOUS

## 2018-02-07 MED ORDER — METOPROLOL TARTRATE 50 MG PO TABS
75.0000 mg | ORAL_TABLET | Freq: Every day | ORAL | Status: DC
  Administered 2018-02-07: 75 mg via ORAL
  Filled 2018-02-07: qty 1

## 2018-02-07 NOTE — Progress Notes (Signed)
[facility] PROGRESS NOTE    Progress Note  Candice Mccann  IRJ:[id number] DOB: [birth year] DOA: 02/04/2018 PCP: Celedonio Savage, MD     Brief Narrative:   Candice Mccann is an 62 y.o. female past medical history significant of diabetes, colon cancer status post resection degenerative disc disease status post surgery, essential hypertension who presents to the ED with left flank pain radiating to her abdomen with nausea and vomiting her UDS was positive for opiates and UDS, lactic acid on admission was 2.5 repeated 2 hours after that is 1.6 cardiac biomarkers have remained flat she has no leukocytosis  Assessment/Plan:   Sinus tachycardia Dimer was positive VQ scan showed no signs of PE Increase metoprolol She denies any chest pain or shortness of breath.  Tachycardia is improved. Had no events on telemetry.  Recurrent abdominal pain: Work-up so far negative. MRI of the back is mild disc bulging. She has some nausea and vomiting this morning.CT scan of the abdomen and pelvis. Physical exam is benign continue Protonix twice a day despite this she continues to be nauseated and vomiting. LFTs are within normal. There are no signs of overt bleeding her hemoglobin is 13.9. Cont Zofran for nausea, she received reglan this am. We have consulted GI for further assistance.  Degenerative disc disease: MRI is no new acute findings continue narcotics for pain.  Diabetes mellitus type 2: Continue to hold metformin agree with sliding scale insulin. She continues to have nausea and vomiting after eating.  Anxiety disorder: Continue current home regimen.  Essential hypertension: Blood pressure seems to be stable continue current management.   DVT prophylaxis: lovenox Family Communication:husband Disposition Plan/Barrier to D/C: unable to determine Code Status:     Code Status Orders  (From admission, onward)        Start     Ordered   02/04/18 2230  Full code   Continuous     02/04/18 2229    Code Status History    This patient has a current code status but no historical code status.        IV Access:    Peripheral IV   Procedures and diagnostic studies:   Ct Abdomen Pelvis Wo Contrast  Result Date: 02/06/2018 CLINICAL DATA:  Left lower quadrant pain radiating to the left flank for several months, worse recently EXAM: CT ABDOMEN AND PELVIS WITHOUT CONTRAST TECHNIQUE: Multidetector CT imaging of the abdomen and pelvis was performed following the standard protocol without IV contrast. COMPARISON:  CT abdomen and pelvis 01/27/2018 FINDINGS: Lower chest: Minimal linear scarring or atelectasis at the lung bases is stable. No pneumonia or effusion is seen and there is no evidence of pulmonary nodule. Mild cardiomegaly is stable. No pericardial effusion is seen. A moderate amount pericardial and epicardial fat is present. As noted previously the distal esophageal mucosa is somewhat thickened possibly due to chronic esophagitis. Correlate clinically. Hepatobiliary: The liver is unremarkable in the unenhanced state. No focal hepatic abnormality is seen. Surgical clips are present from prior cholecystectomy. Pancreas: The pancreas is normal in size and the pancreatic duct is not dilated. Spleen: The spleen is unremarkable. Adrenals/Urinary Tract: The adrenal glands appear stable. No renal calculi are seen and there is no evidence of hydronephrosis. A small renovascular calcification is noted on the right. The ureters appear normal in caliber to the bladder and the urinary bladder although not well distended is unremarkable. Stomach/Bowel: The stomach is moderately well distended with oral contrast media. No abnormality is  seen. No small bowel distention or edema is noted. There is a moderate amount of feces throughout the colon. Surgical ring of sutures is noted in the sigmoid colon from prior partial resection. No diverticulitis is currently seen. No definite  colonic lesion is seen. The terminal ileum is unremarkable there is higher attenuation within the appendix which was not present on the scan from 01/27/2018. Therefore this most likely represents residual contrast rather than appendicoliths. No inflammation is seen. Vascular/Lymphatic: The abdominal aorta is normal in caliber with mild to moderate abdominal aortic atherosclerosis. No adenopathy is seen. Reproductive: The uterus has previously been resected. No adnexal lesion is seen. No fluid is noted within the pelvis. Other: A small amount air is noted in subcutaneous soft tissues of the right anterior pelvis probably due to injection. Correlate clinically. Tiny defects are noted in the anterior abdominal wall of the mid upper abdomen near the midline containing only fat. Musculoskeletal: The lumbar vertebrae are in normal alignment with normal intervertebral disc spaces. There is mild degenerative change of the facet joints of the lower lumbar spine. There are degenerative changes of the SI joints as. IMPRESSION: 1. No explanation for the patient's left lower quadrant and left flank pain is seen. No renal or ureteral calculi are noted and there is no evidence of diverticulitis. 2. Persistent mucosal thickening of the distal esophagus may be due to chronic esophagitis. Correlate clinically. 3. Mild to moderate abdominal aortic atherosclerosis. 4. Tiny defects in the anterior abdominal wall containing only fat. Electronically Signed   By: Ivar Drape M.D.   On: 02/06/2018 13:07   Mr Lumbar Spine Wo Contrast  Result Date: 02/05/2018 CLINICAL DATA:  Radiculopathy, greater than 6 weeks conservative treatment, persistent symptoms. Abdominal pain with nausea. Radicular etiology suspected. EXAM: MRI LUMBAR SPINE WITHOUT CONTRAST TECHNIQUE: Multiplanar, multisequence MR imaging of the lumbar spine was performed. No intravenous contrast was administered. COMPARISON:  CT abdomen and pelvis 01/27/2018. FINDINGS:  Segmentation: 5 non rib-bearing lumbar type vertebral bodies are present. The lowest fully formed vertebral body is L5. Alignment: AP alignment is anatomic. Mild rightward curvature is centered at L1. Vertebrae:  Marrow signal and vertebral body heights are normal. Conus medullaris and cauda equina: Conus extends to the L1 level. Conus and cauda equina appear normal. Paraspinal and other soft tissues: Limited imaging of the abdomen is unremarkable. There is no significant adenopathy. Disc levels: T12-L1: Minimal disc bulging is present. There is no focal stenosis. L1-2: Negative. L2-3: Negative. L3-4: Mild disc bulging is present. Moderate facet hypertrophy is noted bilaterally. This results in mild subarticular and foraminal narrowing bilaterally. L4-5: A leftward broad-based disc protrusion is present. Moderate facet hypertrophy is noted. This results in mild subarticular and foraminal narrowing bilaterally. L5-S1: A leftward disc protrusion is present. Moderate facet hypertrophy is noted. This results an moderate left and mild right subarticular and foraminal stenosis. IMPRESSION: 1. Mild subarticular and foraminal narrowing bilaterally at L3-4 and L4-5 secondary to disc bulging and facet hypertrophy. 2. Moderate left and mild right subarticular and foraminal narrowing at L5-S1 secondary to a leftward disc protrusion in combination with moderate facet hypertrophy bilaterally. Electronically Signed   By: San Morelle M.D.   On: 02/05/2018 21:04   Nm Pulmonary Perf And Vent  Result Date: 02/06/2018 CLINICAL DATA:  Tachypnea.  Shortness of breath, chest pain EXAM: NUCLEAR MEDICINE VENTILATION - PERFUSION LUNG SCAN TECHNIQUE: Ventilation images were obtained in multiple projections using inhaled aerosol Tc-57m DTPA. Perfusion images were obtained in multiple projections  after intravenous injection of Tc-48m-MAA. RADIOPHARMACEUTICALS:  31.6 mCi of Tc-44m DTPA aerosol inhalation and 3.9 mCi Tc2m-MAA IV  COMPARISON:  Chest x-ray 02/04/2018 FINDINGS: Ventilation: No focal ventilation defect. Perfusion: No wedge shaped peripheral perfusion defects to suggest acute pulmonary embolism. IMPRESSION: No evidence of pulmonary embolus Electronically Signed   By: Rolm Baptise M.D.   On: 02/06/2018 10:44     Medical Consultants:    None.  Anti-Infectives:   *None  Subjective:    Candice Mccann continues to have abdominal pain and vomiting this morning.    Objective:    Vitals:   02/06/18 1501 02/06/18 2100 02/07/18 0459 02/07/18 0918  BP: 99/69 (!) 166/77 130/78 (!) 156/106  Pulse: 97 (!) 105 (!) 110 (!) 112  Resp:  18 16   Temp:  98.8 F (37.1 C) 98.6 F (37 C)   TempSrc:  Oral    SpO2: 94% 99% 95%   Weight:      Height:        Intake/Output Summary (Last 24 hours) at 02/07/2018 1043 Last data filed at 02/07/2018 0821 Gross per 24 hour  Intake 940 ml  Output -  Net 940 ml   Filed Weights   02/04/18 1347 02/04/18 2234  Weight: 75.3 kg (166 lb) 75.1 kg (165 lb 9.1 oz)    Exam: General exam: In no acute distress. Respiratory system: Good air movement and clear to auscultation. Cardiovascular system: S1 & S2 heard, RRR.  Gastrointestinal system: Positive bowel sounds soft nontender nondistended. Central nervous system: Alert and oriented. No focal neurological deficits. Extremities: No pedal edema. Skin: No rashes, lesions or ulcers Psychiatry: Judgement and insight appear normal. Mood & affect appropriate.    Data Reviewed:    Labs: Basic Metabolic Panel: Recent Labs  Lab 02/04/18 1351 02/05/18 0445  NA 137 137  K 4.2 3.9  CL 98* 98*  CO2 26 27  GLUCOSE 280* 223*  BUN 15 9  CREATININE 0.73 0.64  CALCIUM 9.4 8.9   GFR Estimated Creatinine Clearance: 69.2 mL/min (by C-G formula based on SCr of 0.64 mg/dL). Liver Function Tests: Recent Labs  Lab 02/04/18 1351 02/05/18 0445  AST 23 17  ALT 20 18  ALKPHOS 98 92  BILITOT 0.8 1.0  PROT 8.7*  8.1  ALBUMIN 4.5 4.0   Recent Labs  Lab 02/04/18 1351  LIPASE 26   No results for input(s): AMMONIA in the last 168 hours. Coagulation profile No results for input(s): INR, PROTIME in the last 168 hours.  CBC: Recent Labs  Lab 02/04/18 1351 02/05/18 0445  WBC 7.9 9.2  HGB 15.3* 13.9  HCT 45.1 41.5  MCV 90.4 88.3  PLT 312 315   Cardiac Enzymes: Recent Labs  Lab 02/04/18 1522 02/04/18 1936  TROPONINI 0.03* 0.03*   BNP (last 3 results) No results for input(s): PROBNP in the last 8760 hours. CBG: Recent Labs  Lab 02/06/18 1131 02/06/18 1524 02/06/18 1634 02/06/18 2229 02/07/18 0820  GLUCAP 343* 208* 132* 101* 289*   D-Dimer: Recent Labs    02/04/18 2031  DDIMER 0.81*   Hgb A1c: Recent Labs    02/05/18 0445  HGBA1C 9.5*   Lipid Profile: No results for input(s): CHOL, HDL, LDLCALC, TRIG, CHOLHDL, LDLDIRECT in the last 72 hours. Thyroid function studies: No results for input(s): TSH, T4TOTAL, T3FREE, THYROIDAB in the last 72 hours.  Invalid input(s): FREET3 Anemia work up: No results for input(s): VITAMINB12, FOLATE, FERRITIN, TIBC, IRON, RETICCTPCT in the last 41  hours. Sepsis Labs: Recent Labs  Lab 02/04/18 1351 02/04/18 1553 02/04/18 1814 02/05/18 0445  WBC 7.9  --   --  9.2  LATICACIDVEN  --  2.25* 1.67  --    Microbiology No results found for this or any previous visit (from the past 240 hour(s)).   Medications:   . amLODipine  5 mg Oral Daily  . aspirin  325 mg Oral Daily  . atorvastatin  20 mg Oral q1800  . cholecalciferol  1,000 Units Oral Daily  . cloNIDine  0.1 mg Oral Daily  . diphenhydrAMINE  50 mg Oral QHS  . enoxaparin (LOVENOX) injection  40 mg Subcutaneous QHS  . glipiZIDE  10 mg Oral BID AC  . hydrochlorothiazide  12.5 mg Oral Daily  . HYDROcodone-acetaminophen  2 tablet Oral Q4H  . insulin aspart  0-15 Units Subcutaneous TID WC  . insulin aspart  0-5 Units Subcutaneous QHS  . insulin aspart  3 Units Subcutaneous TID  WC  . insulin detemir  10 Units Subcutaneous BID  . lisinopril  20 mg Oral Daily  . metoCLOPramide (REGLAN) injection  5 mg Intravenous TID AC  . metoprolol tartrate  75 mg Oral Daily  . nicotine  21 mg Transdermal Daily  . pantoprazole  40 mg Oral BID  . polyethylene glycol  17 g Oral Daily  . potassium chloride SA  20 mEq Oral Daily  . tiZANidine  4 mg Oral QHS  . vitamin B-12  1,000 mcg Oral Daily   Continuous Infusions:    LOS: 3 days   Charlynne Cousins  [facility] Pager [phone]  *Please refer to [email or web address], password [email or web address] to get updated schedule on who will round on this patient, as hospitalists switch teams weekly. If 7PM-7AM, please contact night-coverage at [email or web address], password [email or web address] for any overnight needs.  02/07/2018, 10:43 AM

## 2018-02-07 NOTE — Consult Note (Addendum)
Reason for Consult:hronic abdominal pain Referring Physician: Hospital team  Candice Mccann is an 62 y.o. female.  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 And her last colonoscopy was this spring and currently in addition to the pain she has some nausea and occasional vomiting and she does have some chronic diarrhea and her pain is not changed by eating or moving her bowels urination and she has no other complaints  Past Medical History:  Diagnosis Date  . Cancer [facility])    colon cancer     Past Surgical History:  Procedure Laterality Date  . ABDOMINAL HYSTERECTOMY    . APPENDECTOMY    . BACK SURGERY    . HERNIA REPAIR      History reviewed. No pertinent family history.  Social History:  reports that she has been smoking cigarettes.  She has never used smokeless tobacco. She reports that she drank alcohol. Her drug history is not on file.  Allergies:  Allergies  Allergen Reactions  . Contrast Media [Iodinated Diagnostic Agents] Anaphylaxis and Nausea And Vomiting  . Nitroglycerin Other (See Comments)    Cardiac arrest  . Sulfa Antibiotics Rash    Medications: I have reviewed the patient's current medications.  Results for orders placed or performed during the hospital encounter of 02/04/18 (from the past 48 hour(s))  Glucose, capillary     Status: Abnormal   Collection Time: 02/05/18  4:36 PM  Result Value Ref Range   Glucose-Capillary 205 (H) 65 - 99 mg/dL  Glucose, capillary      Status: Abnormal   Collection Time: 02/05/18  9:05 PM  Result Value Ref Range   Glucose-Capillary 189 (H) 65 - 99 mg/dL   Comment 1 Notify RN   Glucose, capillary     Status: Abnormal   Collection Time: 02/06/18  7:35 AM  Result Value Ref Range   Glucose-Capillary 330 (H) 65 - 99 mg/dL  Glucose, capillary     Status: Abnormal   Collection Time: 02/06/18 11:31 AM  Result Value Ref Range   Glucose-Capillary 343 (H) 65 - 99 mg/dL  Glucose, capillary     Status: Abnormal   Collection Time: 02/06/18  3:24 PM  Result Value Ref Range   Glucose-Capillary 208 (H) 65 - 99 mg/dL  Glucose, capillary     Status: Abnormal   Collection Time: 02/06/18  4:34 PM  Result Value Ref Range   Glucose-Capillary 132 (H) 65 - 99 mg/dL  Glucose, capillary     Status: Abnormal   Collection Time: 02/06/18 10:29 PM  Result Value Ref Range   Glucose-Capillary 101 (H) 65 - 99 mg/dL  Glucose, capillary     Status: Abnormal   Collection Time: 02/07/18  8:20 AM  Result Value Ref Range   Glucose-Capillary 289 (H) 65 - 99 mg/dL  Glucose, capillary     Status: Abnormal   Collection Time: 02/07/18 11:07 AM  Result Value Ref Range   Glucose-Capillary 280 (H) 65 - 99 mg/dL    Ct Abdomen Pelvis  Wo Contrast  Result Date: 02/06/2018 CLINICAL DATA:  Left lower quadrant pain radiating to the left flank for several months, worse recently EXAM: CT ABDOMEN AND PELVIS WITHOUT CONTRAST TECHNIQUE: Multidetector CT imaging of the abdomen and pelvis was performed following the standard protocol without IV contrast. COMPARISON:  CT abdomen and pelvis 01/27/2018 FINDINGS: Lower chest: Minimal linear scarring or atelectasis at the lung bases is stable. No pneumonia or effusion is seen and there is no evidence of pulmonary nodule. Mild cardiomegaly is stable. No pericardial effusion is seen. A moderate amount pericardial and epicardial fat is present. As noted previously the distal esophageal mucosa is somewhat thickened possibly due to  chronic esophagitis. Correlate clinically. Hepatobiliary: The liver is unremarkable in the unenhanced state. No focal hepatic abnormality is seen. Surgical clips are present from prior cholecystectomy. Pancreas: The pancreas is normal in size and the pancreatic duct is not dilated. Spleen: The spleen is unremarkable. Adrenals/Urinary Tract: The adrenal glands appear stable. No renal calculi are seen and there is no evidence of hydronephrosis. A small renovascular calcification is noted on the right. The ureters appear normal in caliber to the bladder and the urinary bladder although not well distended is unremarkable. Stomach/Bowel: The stomach is moderately well distended with oral contrast media. No abnormality is seen. No small bowel distention or edema is noted. There is a moderate amount of feces throughout the colon. Surgical ring of sutures is noted in the sigmoid colon from prior partial resection. No diverticulitis is currently seen. No definite colonic lesion is seen. The terminal ileum is unremarkable there is higher attenuation within the appendix which was not present on the scan from 01/27/2018. Therefore this most likely represents residual contrast rather than appendicoliths. No inflammation is seen. Vascular/Lymphatic: The abdominal aorta is normal in caliber with mild to moderate abdominal aortic atherosclerosis. No adenopathy is seen. Reproductive: The uterus has previously been resected. No adnexal lesion is seen. No fluid is noted within the pelvis. Other: A small amount air is noted in subcutaneous soft tissues of the right anterior pelvis probably due to injection. Correlate clinically. Tiny defects are noted in the anterior abdominal wall of the mid upper abdomen near the midline containing only fat. Musculoskeletal: The lumbar vertebrae are in normal alignment with normal intervertebral disc spaces. There is mild degenerative change of the facet joints of the lower lumbar spine. There are  degenerative changes of the SI joints as. IMPRESSION: 1. No explanation for the patient's left lower quadrant and left flank pain is seen. No renal or ureteral calculi are noted and there is no evidence of diverticulitis. 2. Persistent mucosal thickening of the distal esophagus may be due to chronic esophagitis. Correlate clinically. 3. Mild to moderate abdominal aortic atherosclerosis. 4. Tiny defects in the anterior abdominal wall containing only fat. Electronically Signed   By: Ivar Drape M.D.   On: 02/06/2018 13:07   Mr Lumbar Spine Wo Contrast  Result Date: 02/05/2018 CLINICAL DATA:  Radiculopathy, greater than 6 weeks conservative treatment, persistent symptoms. Abdominal pain with nausea. Radicular etiology suspected. EXAM: MRI LUMBAR SPINE WITHOUT CONTRAST TECHNIQUE: Multiplanar, multisequence MR imaging of the lumbar spine was performed. No intravenous contrast was administered. COMPARISON:  CT abdomen and pelvis 01/27/2018. FINDINGS: Segmentation: 5 non rib-bearing lumbar type vertebral bodies are present. The lowest fully formed vertebral body is L5. Alignment: AP alignment is anatomic. Mild rightward curvature is centered at L1. Vertebrae:  Marrow signal and vertebral body heights are normal. Conus medullaris and cauda equina:  Conus extends to the L1 level. Conus and cauda equina appear normal. Paraspinal and other soft tissues: Limited imaging of the abdomen is unremarkable. There is no significant adenopathy. Disc levels: T12-L1: Minimal disc bulging is present. There is no focal stenosis. L1-2: Negative. L2-3: Negative. L3-4: Mild disc bulging is present. Moderate facet hypertrophy is noted bilaterally. This results in mild subarticular and foraminal narrowing bilaterally. L4-5: A leftward broad-based disc protrusion is present. Moderate facet hypertrophy is noted. This results in mild subarticular and foraminal narrowing bilaterally. L5-S1: A leftward disc protrusion is present. Moderate facet  hypertrophy is noted. This results an moderate left and mild right subarticular and foraminal stenosis. IMPRESSION: 1. Mild subarticular and foraminal narrowing bilaterally at L3-4 and L4-5 secondary to disc bulging and facet hypertrophy. 2. Moderate left and mild right subarticular and foraminal narrowing at L5-S1 secondary to a leftward disc protrusion in combination with moderate facet hypertrophy bilaterally. Electronically Signed   By: San Morelle M.D.   On: 02/05/2018 21:04   Nm Pulmonary Perf And Vent  Result Date: 02/06/2018 CLINICAL DATA:  Tachypnea.  Shortness of breath, chest pain EXAM: NUCLEAR MEDICINE VENTILATION - PERFUSION LUNG SCAN TECHNIQUE: Ventilation images were obtained in multiple projections using inhaled aerosol Tc-64m DTPA. Perfusion images were obtained in multiple projections after intravenous injection of Tc-33m-MAA. RADIOPHARMACEUTICALS:  31.6 mCi of Tc-89m DTPA aerosol inhalation and 3.9 mCi Tc56m-MAA IV COMPARISON:  Chest x-ray 02/04/2018 FINDINGS: Ventilation: No focal ventilation defect. Perfusion: No wedge shaped peripheral perfusion defects to suggest acute pulmonary embolism. IMPRESSION: No evidence of pulmonary embolus Electronically Signed   By: Rolm Baptise M.D.   On: 02/06/2018 10:44    ROSnegative except above Blood pressure (!) 156/106, pulse (!) 112, temperature 98.6 F (37 C), resp. rate 16, height 5\' 2"  (1.575 m), weight 75.1 kg (165 lb 9.1 oz), SpO2 95 %. Physical Examvital signs stable afebrile no acute distress lying comfortably in the bed abdomen is soft nontender can't duplicate the pain CT and labs okay regular x-ray okay  Assessment/Plan: Abdominal pain probably adhesional I see no reason to think her primary gastroenterologist is wrong with the diagnosis however clearly she needs better therapy Plan: First of all would wean all unnecessary medicine she is on then I would try probiotics and an antispasmodic like Bentyl which is usually  covered by insurance 10 mg one to 2 every 6-8 hours and will repeat labs tomorrow and call us back if we can be of any further assistance with this hospital stay and consider an upper GI small bowel series to rule out any specific stricture or other subtle abnormality  Kanoa Phillippi E 02/07/2018, 1:28 PM

## 2018-02-08 DIAGNOSIS — R000 Tachycardia, unspecified: Secondary | ICD-10-CM

## 2018-02-08 LAB — COMPREHENSIVE METABOLIC PANEL
ALBUMIN: 3.5 g/dL (ref 3.5–5.0)
ALT: 17 U/L (ref 14–54)
ANION GAP: 9 (ref 5–15)
AST: 23 U/L (ref 15–41)
Alkaline Phosphatase: 72 U/L (ref 38–126)
BUN: 16 mg/dL (ref 6–20)
CHLORIDE: 101 mmol/L (ref 101–111)
CO2: 27 mmol/L (ref 22–32)
Calcium: 9 mg/dL (ref 8.9–10.3)
Creatinine, Ser: 0.73 mg/dL (ref 0.44–1.00)
GFR calc Af Amer: >60 mL/min (ref >60)
GFR calc non Af Amer: >60 mL/min (ref >60)
GLUCOSE: 149 mg/dL — AB (ref 65–99)
POTASSIUM: 3.5 mmol/L (ref 3.5–5.1)
SODIUM: 137 mmol/L (ref 135–145)
TOTAL PROTEIN: 6.7 g/dL (ref 6.5–8.1)
Total Bilirubin: 0.8 mg/dL (ref 0.3–1.2)

## 2018-02-08 LAB — CBC WITH DIFFERENTIAL/PLATELET
BASOS ABS: 0 10*3/uL (ref 0.0–0.1)
Basophils Relative: 0 %
EOS PCT: 1 %
Eosinophils Absolute: 0.1 10*3/uL (ref 0.0–0.7)
HCT: 37.3 % (ref 36.0–46.0)
Hemoglobin: 12.4 g/dL (ref 12.0–15.0)
LYMPHS ABS: 3 10*3/uL (ref 0.7–4.0)
LYMPHS PCT: 41 %
MCH: 29.8 pg (ref 26.0–34.0)
MCHC: 33.2 g/dL (ref 30.0–36.0)
MCV: 89.7 fL (ref 78.0–100.0)
MONO ABS: 0.7 10*3/uL (ref 0.1–1.0)
MONOS PCT: 9 %
NEUTROS ABS: 3.5 10*3/uL (ref 1.7–7.7)
Neutrophils Relative %: 49 %
PLATELETS: 257 10*3/uL (ref 150–400)
RBC: 4.16 MIL/uL (ref 3.87–5.11)
RDW: 13.2 % (ref 11.5–15.5)
WBC: 7.2 10*3/uL (ref 4.0–10.5)

## 2018-02-08 LAB — GLUCOSE, CAPILLARY
GLUCOSE-CAPILLARY: 261 mg/dL — AB (ref 65–99)
Glucose-Capillary: 167 mg/dL — ABNORMAL HIGH (ref 65–99)
Glucose-Capillary: 171 mg/dL — ABNORMAL HIGH (ref 65–99)

## 2018-02-08 MED ORDER — METOCLOPRAMIDE HCL 10 MG PO TABS: 10.0000 mg | ORAL_TABLET | Freq: Three times a day (TID) | ORAL | 0 refills | Status: DC

## 2018-02-08 MED ORDER — METOPROLOL TARTRATE 50 MG PO TABS
50.0000 mg | ORAL_TABLET | Freq: Two times a day (BID) | ORAL | Status: DC
  Administered 2018-02-08: 50 mg via ORAL
  Filled 2018-02-08: qty 1

## 2018-02-08 MED ORDER — SODIUM CHLORIDE 0.9 % IV BOLUS
500.0000 mL | Freq: Once | INTRAVENOUS | Status: AC
  Administered 2018-02-08: 500 mL via INTRAVENOUS

## 2018-02-08 MED ORDER — METOPROLOL TARTRATE 25 MG PO TABS: 25.0000 mg | ORAL_TABLET | Freq: Two times a day (BID) | ORAL | 0 refills | Status: DC

## 2018-02-08 MED ORDER — TIZANIDINE HCL 4 MG PO CAPS: 4.0000 mg | ORAL_CAPSULE | Freq: Every evening | ORAL | 0 refills | Status: DC | PRN

## 2018-02-08 MED ORDER — POLYETHYLENE GLYCOL 3350 17 G PO PACK: 17.0000 g | PACK | Freq: Every day | ORAL | 0 refills | Status: DC

## 2018-02-08 MED ORDER — DIPHENHYDRAMINE-APAP (SLEEP) 25-500 MG PO TABS: 2.0000 | ORAL_TABLET | Freq: Every evening | ORAL | 0 refills | Status: DC | PRN

## 2018-02-08 MED ORDER — PANTOPRAZOLE SODIUM 40 MG PO TBEC: 40.0000 mg | DELAYED_RELEASE_TABLET | Freq: Every day | ORAL | 0 refills | Status: DC

## 2018-02-08 NOTE — Discharge Summary (Signed)
Discharge Summary  Candice Mccann RAX:[id number] DOB: [birth year]  PCP: Celedonio Savage, MD  Admit date: 02/04/2018 Discharge date: 02/08/2018  Time spent:  <27mins  Recommendations for Outpatient Follow-up:  1. F/u with PMD within a week  for hospital discharge follow up, repeat cbc/bmp at follow up. pmd to continue monitor blood glucose control, a1c 9.5. 2. F/u with GI  Discharge Diagnoses:  Active Hospital Problems   Diagnosis Date Noted  . Sinus tachycardia 02/04/2018  . Tobacco abuse 02/04/2018  . Abdominal pain 02/04/2018  . History of colon cancer 02/04/2018  . Benign essential HTN 02/04/2018  . DDD (degenerative disc disease), thoracolumbar 02/04/2018  . DM (diabetes mellitus) ([facility]) 02/04/2018  . Hyperlipemia 02/04/2018  . Anxiety disorder 02/04/2018    Resolved Hospital Problems  No resolved problems to display.    Discharge Condition: stable  Diet recommendation: heart healthy/carb modified  Filed Weights   02/04/18 1347 02/04/18 2234  Weight: 75.3 kg (166 lb) 75.1 kg (165 lb 9.1 oz)    History of present illness: (per admitting MD Dr Jonelle Sidle) PCP: Celedonio Savage, MD   Outpatient Specialists: None   Patient coming from: Home  Chief Complaint: Abdominal pain, nausea with vomiting  HPI: Candice Mccann is a 62 y.o. female with medical history significant of diabetes, colon cancer status post resection, degenerative disc disease status post previous back surgeries, hypertension, hyperlipidemia and anxiety disorder who presented to the ER with persistent left flank pain going to her abdomen with nausea and vomiting.  Symptoms have been going on for several weeks on and off.  She has been to different emergency rooms to have that evaluated.  Has had CT scans and other test results in [place] which were all negative.  Family brought her here to the ER for further evaluation.  Patient's work-up so far has been negative however she was noted to have persistent  sinus tachycardia.  Despite fluid resuscitation and pain medications her heart rate has remained in the 120s to 140s.  Family reported that they have been using a lot of pesticides around the house.  She does not seem to have other symptoms of organophosphate poisoning.  Not clear what other chemicals in the pesticides that the use.  Family reported previous back surgeries.  The nature of the pain is from the back to the front.  Rated as 10 out of 10 not relieved by anything aggravated by movement and even at rest.  ED Course: Patient's vitals indicate a blood pressure of 203 about 117 pulse of 128 respiratory 20 and oxygen sat 93% on room air.  White count is 7.9 with hemoglobin 15.3.  Troponin 0 0.03 chloride 98 but regular creatinine.  Glucose is 280.     Hospital Course:  Principal Problem:   Sinus tachycardia Active Problems:   Tobacco abuse   Abdominal pain   History of colon cancer   Benign essential HTN   DDD (degenerative disc disease), thoracolumbar   DM (diabetes mellitus) ([facility])   Hyperlipemia   Anxiety disorder   Sinus tachycardia -Dimer was positive VQ scan showed no signs of PE -Had no events on telemetry. -resolved, likely reactive. She is on lopressor 25mg  daily at home this is charged to lopressor 25mg  bid pmd to check tsh.  Recurrent abdominal pain: With h/o abdominal surgery Work-up so far negative. MRI of the back is mild disc bulging. LFTs are within normal. CT scan of the abdomen and pelvis no acute findings dose show (Persistent  mucosal thickening of the distal esophagus may be due to chronic esophagitis),  Physical exam is benign continue  Her symptom has much improved on Protonix and reglan Gi consulted, she is advised to follow up with her primary gi    Degenerative disc disease: MRI is no new acute findings continue narcotics for pain.  Diabetes mellitus type 2: Previously noninsulin dependent a1c 9.5 Resume home meds metformin and  glipizide She reports only check her blood glucose once a day at most, She is advised to check her blood glucose 2-3 times a day and follow up with pmd for blood glucose control.  Anxiety disorder: Continue current home regimen.  Essential hypertension: Continue home meds lisinpril/hctz, lopressor ( changed to 25mg  bid from 25mg  daily) and  clonidine 0.1 daily,  further bp meds adjustment for pmd.   DVT prophylaxis: lovenox Family Communication:husband  Disposition Plan/Barrier to D/C:home     Procedures:  none  Consultations:  [facility]  Discharge Exam: BP (!) 84/54 (BP Location: Left Arm)   Pulse 80   Temp 97.6 F (36.4 C) (Oral)   Resp 15   Ht 5\' 2"  (1.575 m)   Wt 75.1 kg (165 lb 9.1 oz)   SpO2 100%   BMI 30.28 kg/m   General: NAD Cardiovascular: RRR Respiratory: CTABL  Discharge Instructions You were cared for by a hospitalist during your hospital stay. If you have any questions about your discharge medications or the care you received while you were in the hospital after you are discharged, you can call the unit and asked to speak with the hospitalist on call if the hospitalist that took care of you is not available. Once you are discharged, your primary care physician will handle any further medical issues. Please note that NO REFILLS for any discharge medications will be authorized once you are discharged, as it is imperative that you return to your primary care physician (or establish a relationship with a primary care physician if you do not have one) for your aftercare needs so that they can reassess your need for medications and monitor your lab values.  Discharge Instructions    Diet - low sodium heart healthy   Complete by:  As directed    Carb modified   Discharge instructions   Complete by:  As directed    Please check your blood pressure at home, bring in records for your doctor to review at hospital discharge follow up.   Increase activity  slowly   Complete by:  As directed      Allergies as of 02/08/2018      Reactions   Contrast Media [iodinated Diagnostic Agents] Anaphylaxis, Nausea And Vomiting   Nitroglycerin Other (See Comments)   Cardiac arrest   Sulfa Antibiotics Rash      Medication List    TAKE these medications   ALPRAZolam 1 MG tablet Commonly known as:  XANAX Take 1 mg by mouth 2 (two) times daily as needed for anxiety.   aspirin 325 MG EC tablet Take 325 mg by mouth daily.   cholecalciferol 1000 units tablet Commonly known as:  VITAMIN D Take 1,000 Units by mouth daily.   cloNIDine 0.1 MG tablet Commonly known as:  CATAPRES Take 0.1 mg by mouth daily.   diphenhydramine-acetaminophen 25-500 MG Tabs tablet Commonly known as:  TYLENOL PM Take 2 tablets by mouth at bedtime as needed. What changed:    when to take this  reasons to take this   glipiZIDE 10 MG  tablet Commonly known as:  GLUCOTROL Take 10 mg by mouth 2 (two) times daily before a meal.   HYDROcodone-acetaminophen 10-325 MG tablet Commonly known as:  NORCO Take 1 tablet by mouth every 4 (four) hours.   lisinopril-hydrochlorothiazide 20-12.5 MG tablet Commonly known as:  PRINZIDE,ZESTORETIC Take 1 tablet by mouth daily. Notes to patient:  Resume home regimen   metFORMIN 500 MG tablet Commonly known as:  GLUCOPHAGE Take 500 mg by mouth 2 (two) times daily with a meal. Notes to patient:  Resume home regimen   metoCLOPramide 10 MG tablet Commonly known as:  REGLAN Take 1 tablet (10 mg total) by mouth 3 (three) times daily with meals for 10 days.   metoprolol tartrate 25 MG tablet Commonly known as:  LOPRESSOR Take 1 tablet (25 mg total) by mouth 2 (two) times daily. What changed:  when to take this   pantoprazole 40 MG tablet Commonly known as:  PROTONIX Take 1 tablet (40 mg total) by mouth daily.   polyethylene glycol packet Commonly known as:  MIRALAX / GLYCOLAX Take 17 g by mouth daily. Start taking on:   02/09/2018   potassium chloride SA 20 MEQ tablet Commonly known as:  K-DUR,KLOR-CON Take 20 mEq by mouth daily.   simvastatin 40 MG tablet Commonly known as:  ZOCOR Take 40 mg by mouth daily at 6 PM.   tiZANidine 4 MG capsule Commonly known as:  ZANAFLEX Take 1 capsule (4 mg total) by mouth at bedtime as needed for muscle spasms. What changed:    when to take this  reasons to take this   vitamin B-12 1000 MCG tablet Commonly known as:  CYANOCOBALAMIN Take 1,000 mcg by mouth daily.      Allergies  Allergen Reactions  . Contrast Media [Iodinated Diagnostic Agents] Anaphylaxis and Nausea And Vomiting  . Nitroglycerin Other (See Comments)    Cardiac arrest  . Sulfa Antibiotics Rash   Follow-up Information    Celedonio Savage, MD. Schedule an appointment as soon as possible for a visit in 1 week(s).   Specialty:  Family Medicine Why:  hospital discharge follow up. repeat cbc/bmp TSH at hospital follow up. your a1c is elevated at 9.5%. please check your blood glusoce 2-3 times a day and work with your pcp for blood sugar control. Contact information: [address] [place] [address] [phone]        follow up with your gastroenterologist. Schedule an appointment as soon as possible for a visit in 3 week(s).            The results of significant diagnostics from this hospitalization (including imaging, microbiology, ancillary and laboratory) are listed below for reference.    Significant Diagnostic Studies: Ct Abdomen Pelvis Wo Contrast  Result Date: 02/06/2018 CLINICAL DATA:  Left lower quadrant pain radiating to the left flank for several months, worse recently EXAM: CT ABDOMEN AND PELVIS WITHOUT CONTRAST TECHNIQUE: Multidetector CT imaging of the abdomen and pelvis was performed following the standard protocol without IV contrast. COMPARISON:  CT abdomen and pelvis 01/27/2018 FINDINGS: Lower chest: Minimal linear scarring or atelectasis at the lung bases is  stable. No pneumonia or effusion is seen and there is no evidence of pulmonary nodule. Mild cardiomegaly is stable. No pericardial effusion is seen. A moderate amount pericardial and epicardial fat is present. As noted previously the distal esophageal mucosa is somewhat thickened possibly due to chronic esophagitis. Correlate clinically. Hepatobiliary: The liver is unremarkable in the unenhanced state. No focal  hepatic abnormality is seen. Surgical clips are present from prior cholecystectomy. Pancreas: The pancreas is normal in size and the pancreatic duct is not dilated. Spleen: The spleen is unremarkable. Adrenals/Urinary Tract: The adrenal glands appear stable. No renal calculi are seen and there is no evidence of hydronephrosis. A small renovascular calcification is noted on the right. The ureters appear normal in caliber to the bladder and the urinary bladder although not well distended is unremarkable. Stomach/Bowel: The stomach is moderately well distended with oral contrast media. No abnormality is seen. No small bowel distention or edema is noted. There is a moderate amount of feces throughout the colon. Surgical ring of sutures is noted in the sigmoid colon from prior partial resection. No diverticulitis is currently seen. No definite colonic lesion is seen. The terminal ileum is unremarkable there is higher attenuation within the appendix which was not present on the scan from 01/27/2018. Therefore this most likely represents residual contrast rather than appendicoliths. No inflammation is seen. Vascular/Lymphatic: The abdominal aorta is normal in caliber with mild to moderate abdominal aortic atherosclerosis. No adenopathy is seen. Reproductive: The uterus has previously been resected. No adnexal lesion is seen. No fluid is noted within the pelvis. Other: A small amount air is noted in subcutaneous soft tissues of the right anterior pelvis probably due to injection. Correlate clinically. Tiny defects  are noted in the anterior abdominal wall of the mid upper abdomen near the midline containing only fat. Musculoskeletal: The lumbar vertebrae are in normal alignment with normal intervertebral disc spaces. There is mild degenerative change of the facet joints of the lower lumbar spine. There are degenerative changes of the SI joints as. IMPRESSION: 1. No explanation for the patient's left lower quadrant and left flank pain is seen. No renal or ureteral calculi are noted and there is no evidence of diverticulitis. 2. Persistent mucosal thickening of the distal esophagus may be due to chronic esophagitis. Correlate clinically. 3. Mild to moderate abdominal aortic atherosclerosis. 4. Tiny defects in the anterior abdominal wall containing only fat. Electronically Signed   By: Ivar Drape M.D.   On: 02/06/2018 13:07   Mr Lumbar Spine Wo Contrast  Result Date: 02/05/2018 CLINICAL DATA:  Radiculopathy, greater than 6 weeks conservative treatment, persistent symptoms. Abdominal pain with nausea. Radicular etiology suspected. EXAM: MRI LUMBAR SPINE WITHOUT CONTRAST TECHNIQUE: Multiplanar, multisequence MR imaging of the lumbar spine was performed. No intravenous contrast was administered. COMPARISON:  CT abdomen and pelvis 01/27/2018. FINDINGS: Segmentation: 5 non rib-bearing lumbar type vertebral bodies are present. The lowest fully formed vertebral body is L5. Alignment: AP alignment is anatomic. Mild rightward curvature is centered at L1. Vertebrae:  Marrow signal and vertebral body heights are normal. Conus medullaris and cauda equina: Conus extends to the L1 level. Conus and cauda equina appear normal. Paraspinal and other soft tissues: Limited imaging of the abdomen is unremarkable. There is no significant adenopathy. Disc levels: T12-L1: Minimal disc bulging is present. There is no focal stenosis. L1-2: Negative. L2-3: Negative. L3-4: Mild disc bulging is present. Moderate facet hypertrophy is noted bilaterally.  This results in mild subarticular and foraminal narrowing bilaterally. L4-5: A leftward broad-based disc protrusion is present. Moderate facet hypertrophy is noted. This results in mild subarticular and foraminal narrowing bilaterally. L5-S1: A leftward disc protrusion is present. Moderate facet hypertrophy is noted. This results an moderate left and mild right subarticular and foraminal stenosis. IMPRESSION: 1. Mild subarticular and foraminal narrowing bilaterally at L3-4 and L4-5 secondary to disc bulging  and facet hypertrophy. 2. Moderate left and mild right subarticular and foraminal narrowing at L5-S1 secondary to a leftward disc protrusion in combination with moderate facet hypertrophy bilaterally. Electronically Signed   By: San Morelle M.D.   On: 02/05/2018 21:04   Nm Pulmonary Perf And Vent  Result Date: 02/06/2018 CLINICAL DATA:  Tachypnea.  Shortness of breath, chest pain EXAM: NUCLEAR MEDICINE VENTILATION - PERFUSION LUNG SCAN TECHNIQUE: Ventilation images were obtained in multiple projections using inhaled aerosol Tc-35m DTPA. Perfusion images were obtained in multiple projections after intravenous injection of Tc-20m-MAA. RADIOPHARMACEUTICALS:  31.6 mCi of Tc-25m DTPA aerosol inhalation and 3.9 mCi Tc22m-MAA IV COMPARISON:  Chest x-ray 02/04/2018 FINDINGS: Ventilation: No focal ventilation defect. Perfusion: No wedge shaped peripheral perfusion defects to suggest acute pulmonary embolism. IMPRESSION: No evidence of pulmonary embolus Electronically Signed   By: Rolm Baptise M.D.   On: 02/06/2018 10:44   Dg Abdomen Acute W/chest  Result Date: 02/04/2018 CLINICAL DATA:  Several weeks of abdominal pain radiating to back with nausea and vomiting. EXAM: DG ABDOMEN ACUTE W/ 1V CHEST COMPARISON:  None. FINDINGS: Chest radiograph demonstrates the lungs to be clear. Cardiomediastinal silhouette is within normal. Remainder of the chest radiograph is unremarkable. Abdominopelvic films demonstrate a  nonobstructive bowel gas pattern. There is no focal mass or mass effect. No free peritoneal air. Surgical clips over the right upper quadrant. Minimal degenerate change of the spine. IMPRESSION: Nonobstructive bowel gas pattern. No acute cardiopulmonary disease. Electronically Signed   By: Marin Olp M.D.   On: 02/04/2018 20:11    Microbiology: No results found for this or any previous visit (from the past 240 hour(s)).   Labs: Basic Metabolic Panel: Recent Labs  Lab 02/04/18 1351 02/05/18 0445 02/08/18 0518  NA 137 137 137  K 4.2 3.9 3.5  CL 98* 98* 101  CO2 26 27 27   GLUCOSE 280* 223* 149*  BUN 15 9 16   CREATININE 0.73 0.64 0.73  CALCIUM 9.4 8.9 9.0   Liver Function Tests: Recent Labs  Lab 02/04/18 1351 02/05/18 0445 02/08/18 0518  AST 23 17 23   ALT 20 18 17   ALKPHOS 98 92 72  BILITOT 0.8 1.0 0.8  PROT 8.7* 8.1 6.7  ALBUMIN 4.5 4.0 3.5   Recent Labs  Lab 02/04/18 1351  LIPASE 26   No results for input(s): AMMONIA in the last 168 hours. CBC: Recent Labs  Lab 02/04/18 1351 02/05/18 0445 02/08/18 0518  WBC 7.9 9.2 7.2  NEUTROABS  --   --  3.5  HGB 15.3* 13.9 12.4  HCT 45.1 41.5 37.3  MCV 90.4 88.3 89.7  PLT 312 315 257   Cardiac Enzymes: Recent Labs  Lab 02/04/18 1522 02/04/18 1936  TROPONINI 0.03* 0.03*   BNP: BNP (last 3 results) No results for input(s): BNP in the last 8760 hours.  ProBNP (last 3 results) No results for input(s): PROBNP in the last 8760 hours.  CBG: Recent Labs  Lab 02/06/18 2229 02/07/18 0820 02/07/18 1107 02/07/18 1702 02/08/18 0741  GLUCAP 101* 289* 280* 113* 171*       Signed:  Florencia Reasons MD, PhD  [facility] 02/08/2018, 1:16 PM

## 2018-06-07 ENCOUNTER — Ambulatory Visit (INDEPENDENT_AMBULATORY_CARE_PROVIDER_SITE_OTHER): Payer: Medicare Other | Admitting: Gastroenterology

## 2018-06-07 ENCOUNTER — Encounter: Payer: Self-pay | Admitting: *Deleted

## 2018-06-07 ENCOUNTER — Telehealth: Payer: Self-pay | Admitting: *Deleted

## 2018-06-07 ENCOUNTER — Other Ambulatory Visit: Payer: Self-pay | Admitting: *Deleted

## 2018-06-07 ENCOUNTER — Encounter: Payer: Self-pay | Admitting: Gastroenterology

## 2018-06-07 VITALS — BP 108/74 | HR 108 | Temp 97.2°F | Ht 62.0 in | Wt 162.8 lb

## 2018-06-07 DIAGNOSIS — R1319 Other dysphagia: Secondary | ICD-10-CM

## 2018-06-07 DIAGNOSIS — R933 Abnormal findings on diagnostic imaging of other parts of digestive tract: Secondary | ICD-10-CM | POA: Diagnosis not present

## 2018-06-07 DIAGNOSIS — R1310 Dysphagia, unspecified: Secondary | ICD-10-CM | POA: Diagnosis not present

## 2018-06-07 DIAGNOSIS — K8500 Idiopathic acute pancreatitis without necrosis or infection: Secondary | ICD-10-CM | POA: Diagnosis not present

## 2018-06-07 NOTE — Patient Instructions (Signed)
1. Upper endoscopy as scheduled.  Please see separate instructions. 2. I will obtain prior records regarding pancreatitis, further recommendations to follow.

## 2018-06-07 NOTE — H&P (View-Only) (Signed)
Primary Care Physician:  Sandi Mealy, MD  Primary Gastroenterologist:  Garfield Cornea, MD   Chief Complaint  Patient presents with  . Dysphagia    worse with liquids    HPI:  Candice Mccann is a 62 y.o. female here at the request of Dr. Lorra Hals for further evaluation of esophageal thickening and idiopathic chronic pancreatitis.  CT abdomen and pelvis without contrast at [facility] on May 16, 2018 for abdominal pain showed esophageal wall circumferentially thickened, possibly esophagitis but cannot exclude esophageal carcinoma.  Pancreatic head remains prominent but there is now peripancreatic exudate present extending into the left pericolic gutter and strandiness throughout the peripancreatic fat planes consistent with acute pancreatitis.  Patient was admitted at [facility] on May 16, 2018.  Lipase was over 2000.  Etiology unclear.  Her HCTZ was stopped. But pcp put back on it.   EUS in 2016, Dr. Christoper Fabian, presumed biliary etiology of pancreatitis. Pancreatic parenchyma diffusely increased in heterogeneity throughout with worsened severity within the head region at the confluence of the bile and pancreatic duct. The head region also contained hyperechoic linear stranding forming lobularity. Dr. Christoper Fabian recommended six month f/u CT. Per patient, Dr. Britta Mccreedy said pancreatitis brought on by pill she was on back then, it was stopped. She cannot recall the name.    Dysphagia for years mostly to liquids. Sometimes liquids won't go down, comes up through mouth and nose. Swallows pills ok. Swallows solids ok. no heartburn. Denies abd pain at this time. bm regular. She has tendency towards loose stools. She has used [place] with good results as needed. No melena, brbpr. CTs in May and June with abnormal appearing esophagus as outlined.   She has h/o colon cancer in 2009 s/p resection, no chemo or radiation. Colonoscopy in 09/2017 by Dr. Anthony Sar reported as normal.    Current Outpatient Medications  Medication Sig Dispense Refill  . ALPRAZolam (XANAX) 1 MG tablet Take 1 mg by mouth 2 (two) times daily as needed for anxiety.    Marland Kitchen aspirin 325 MG EC tablet Take 325 mg by mouth daily.    . cloNIDine (CATAPRES) 0.1 MG tablet Take 0.1 mg by mouth daily.    . diphenhydramine-acetaminophen (TYLENOL PM) 25-500 MG TABS tablet Take 2 tablets by mouth at bedtime as needed. 10 tablet 0  . glipiZIDE (GLUCOTROL) 10 MG tablet Take 10 mg by mouth 2 (two) times daily before a meal.    . HYDROcodone-acetaminophen (NORCO) 10-325 MG tablet Take 1 tablet by mouth every 6 (six) hours as needed.     Marland Kitchen lisinopril-hydrochlorothiazide (PRINZIDE,ZESTORETIC) 20-12.5 MG tablet Take 1 tablet by mouth daily.    . metFORMIN (GLUCOPHAGE) 500 MG tablet Take 1,000 mg by mouth 2 (two) times daily with a meal.     . metoprolol tartrate (LOPRESSOR) 25 MG tablet Take 1 tablet (25 mg total) by mouth 2 (two) times daily. (Patient taking differently: Take 25 mg by mouth daily. ) 60 tablet 0  . potassium chloride SA (K-DUR,KLOR-CON) 20 MEQ tablet Take 20 mEq by mouth daily.    . simvastatin (ZOCOR) 40 MG tablet Take 40 mg by mouth daily at 6 PM.    . tiZANidine (ZANAFLEX) 4 MG capsule Take 1 capsule (4 mg total) by mouth at bedtime as needed for muscle spasms. 10 capsule 0  . vitamin B-12 (CYANOCOBALAMIN) 1000 MCG tablet Take 1,000 mcg by mouth daily.     No current facility-administered medications for this visit.  Allergies as of 06/07/2018 - Review Complete 06/07/2018  Allergen Reaction Noted  . Contrast media [iodinated diagnostic agents] Anaphylaxis and Nausea And Vomiting 02/04/2018  . Nitroglycerin Other (See Comments) 02/04/2018  . Sulfa antibiotics Rash 02/04/2018    Past Medical History:  Diagnosis Date  . Anxiety   . Colon cancer ([place])   . Diabetes ([place])   . Hypertension     Past Surgical History:  Procedure Laterality Date  . ABDOMINAL HYSTERECTOMY    . BACK SURGERY     . CHOLECYSTECTOMY    . COLON SURGERY  2009   colon cancer  . COLONOSCOPY  09/2017   Dr. Burke Keels: Normal colonoscopy  . EUS  2016   Dr. Christoper Fabian   . HERNIA REPAIR      No family history on file.  Social History   Socioeconomic History  . Marital status: Married    Spouse name: Not on file  . Number of children: Not on file  . Years of education: Not on file  . Highest education level: Not on file  Occupational History  . Not on file  Social Needs  . Financial resource strain: Not on file  . Food insecurity:    Worry: Not on file    Inability: Not on file  . Transportation needs:    Medical: Not on file    Non-medical: Not on file  Tobacco Use  . Smoking status: Current Every Day Smoker    Types: Cigarettes  . Smokeless tobacco: Never Used  Substance and Sexual Activity  . Alcohol use: Not Currently  . Drug use: Never  . Sexual activity: Not on file  Lifestyle  . Physical activity:    Days per week: Not on file    Minutes per session: Not on file  . Stress: Not on file  Relationships  . Social connections:    Talks on phone: Not on file    Gets together: Not on file    Attends religious service: Not on file    Active member of club or organization: Not on file    Attends meetings of clubs or organizations: Not on file    Relationship status: Not on file  . Intimate partner violence:    Fear of current or ex partner: Not on file    Emotionally abused: Not on file    Physically abused: Not on file    Forced sexual activity: Not on file  Other Topics Concern  . Not on file  Social History Narrative  . Not on file      ROS:  General: Negative for anorexia, weight loss, fever, chills, fatigue, weakness. Eyes: Negative for vision changes.  ENT: Negative for hoarseness,  nasal congestion. CV: Negative for chest pain, angina, palpitations, dyspnea on exertion, peripheral edema.  Respiratory: Negative for dyspnea at rest, dyspnea on exertion, cough,  sputum, wheezing.  GI: See history of present illness. GU:  Negative for dysuria, hematuria, urinary incontinence, urinary frequency, nocturnal urination.  MS: Negative for joint pain, ++low back pain.  Derm: Negative for rash or itching.  Neuro: Negative for weakness, abnormal sensation, seizure, frequent headaches, memory loss, confusion.  Psych: Negative for anxiety, depression, suicidal ideation, hallucinations.  Endo: Negative for unusual weight change.  Heme: Negative for bruising or bleeding. Allergy: Negative for rash or hives.    Physical Examination:  BP 108/74   Pulse (!) 108   Temp (!) 97.2 F (36.2 C) (Oral)   Ht 5\' 2"  (1.575 m)  Wt 162 lb 12.8 oz (73.8 kg)   BMI 29.78 kg/m    General: Well-nourished, well-developed in no acute distress.  Head: Normocephalic, atraumatic.   Eyes: Conjunctiva pink, no icterus. Mouth: Oropharyngeal mucosa moist and pink , no lesions erythema or exudate. Neck: Supple without thyromegaly, masses, or lymphadenopathy.  Lungs: Clear to auscultation bilaterally.  Heart: Regular rate and rhythm, no murmurs rubs or gallops.  Abdomen: Bowel sounds are normal, nontender, nondistended, no hepatosplenomegaly or masses, no abdominal bruits or    hernia , no rebound or guarding.   Rectal: not performed Extremities: No lower extremity edema. No clubbing or deformities.  Neuro: Alert and oriented x 4 , grossly normal neurologically.  Skin: Warm and dry, no rash or jaundice.   Psych: Alert and cooperative, normal mood and affect.  Labs: Lab Results  Component Value Date   CREATININE 0.73 02/08/2018   BUN 16 02/08/2018   NA 137 02/08/2018   K 3.5 02/08/2018   CL 101 02/08/2018   CO2 27 02/08/2018   Lab Results  Component Value Date   ALT 17 02/08/2018   AST 23 02/08/2018   ALKPHOS 72 02/08/2018   BILITOT 0.8 02/08/2018   Lab Results  Component Value Date   WBC 7.2 02/08/2018   HGB 12.4 02/08/2018   HCT 37.3 02/08/2018   MCV 89.7  02/08/2018   PLT 257 02/08/2018    Lab Results  Component Value Date   LIPASE 26 02/04/2018    Imaging Studies: No results found.

## 2018-06-07 NOTE — Progress Notes (Signed)
Primary Care Physician:  Sandi Mealy, MD  Primary Gastroenterologist:  Garfield Cornea, MD   Chief Complaint  Patient presents with  . Dysphagia    worse with liquids    HPI:  Candice Mccann is a 62 y.o. female here at the request of Dr. Lorra Hals for further evaluation of esophageal thickening and idiopathic chronic pancreatitis.  CT abdomen and pelvis without contrast at [facility] on May 16, 2018 for abdominal pain showed esophageal wall circumferentially thickened, possibly esophagitis but cannot exclude esophageal carcinoma.  Pancreatic head remains prominent but there is now peripancreatic exudate present extending into the left pericolic gutter and strandiness throughout the peripancreatic fat planes consistent with acute pancreatitis.  Patient was admitted at [facility] on May 16, 2018.  Lipase was over 2000.  Etiology unclear.  Her HCTZ was stopped. But pcp put back on it.   EUS in 2016, Dr. Christoper Fabian, presumed biliary etiology of pancreatitis. Pancreatic parenchyma diffusely increased in heterogeneity throughout with worsened severity within the head region at the confluence of the bile and pancreatic duct. The head region also contained hyperechoic linear stranding forming lobularity. Dr. Christoper Fabian recommended six month f/u CT. Per patient, Dr. Britta Mccreedy said pancreatitis brought on by pill she was on back then, it was stopped. She cannot recall the name.    Dysphagia for years mostly to liquids. Sometimes liquids won't go down, comes up through mouth and nose. Swallows pills ok. Swallows solids ok. no heartburn. Denies abd pain at this time. bm regular. She has tendency towards loose stools. She has used [place] with good results as needed. No melena, brbpr. CTs in May and June with abnormal appearing esophagus as outlined.   She has h/o colon cancer in 2009 s/p resection, no chemo or radiation. Colonoscopy in 09/2017 by Dr. Anthony Sar reported as normal.    Current Outpatient Medications  Medication Sig Dispense Refill  . ALPRAZolam (XANAX) 1 MG tablet Take 1 mg by mouth 2 (two) times daily as needed for anxiety.    Marland Kitchen aspirin 325 MG EC tablet Take 325 mg by mouth daily.    . cloNIDine (CATAPRES) 0.1 MG tablet Take 0.1 mg by mouth daily.    . diphenhydramine-acetaminophen (TYLENOL PM) 25-500 MG TABS tablet Take 2 tablets by mouth at bedtime as needed. 10 tablet 0  . glipiZIDE (GLUCOTROL) 10 MG tablet Take 10 mg by mouth 2 (two) times daily before a meal.    . HYDROcodone-acetaminophen (NORCO) 10-325 MG tablet Take 1 tablet by mouth every 6 (six) hours as needed.     Marland Kitchen lisinopril-hydrochlorothiazide (PRINZIDE,ZESTORETIC) 20-12.5 MG tablet Take 1 tablet by mouth daily.    . metFORMIN (GLUCOPHAGE) 500 MG tablet Take 1,000 mg by mouth 2 (two) times daily with a meal.     . metoprolol tartrate (LOPRESSOR) 25 MG tablet Take 1 tablet (25 mg total) by mouth 2 (two) times daily. (Patient taking differently: Take 25 mg by mouth daily. ) 60 tablet 0  . potassium chloride SA (K-DUR,KLOR-CON) 20 MEQ tablet Take 20 mEq by mouth daily.    . simvastatin (ZOCOR) 40 MG tablet Take 40 mg by mouth daily at 6 PM.    . tiZANidine (ZANAFLEX) 4 MG capsule Take 1 capsule (4 mg total) by mouth at bedtime as needed for muscle spasms. 10 capsule 0  . vitamin B-12 (CYANOCOBALAMIN) 1000 MCG tablet Take 1,000 mcg by mouth daily.     No current facility-administered medications for this visit.  Allergies as of 06/07/2018 - Review Complete 06/07/2018  Allergen Reaction Noted  . Contrast media [iodinated diagnostic agents] Anaphylaxis and Nausea And Vomiting 02/04/2018  . Nitroglycerin Other (See Comments) 02/04/2018  . Sulfa antibiotics Rash 02/04/2018    Past Medical History:  Diagnosis Date  . Anxiety   . Colon cancer ([place])   . Diabetes ([place])   . Hypertension     Past Surgical History:  Procedure Laterality Date  . ABDOMINAL HYSTERECTOMY    . BACK SURGERY     . CHOLECYSTECTOMY    . COLON SURGERY  2009   colon cancer  . COLONOSCOPY  09/2017   Dr. Burke Keels: Normal colonoscopy  . EUS  2016   Dr. Christoper Fabian   . HERNIA REPAIR      No family history on file.  Social History   Socioeconomic History  . Marital status: Married    Spouse name: Not on file  . Number of children: Not on file  . Years of education: Not on file  . Highest education level: Not on file  Occupational History  . Not on file  Social Needs  . Financial resource strain: Not on file  . Food insecurity:    Worry: Not on file    Inability: Not on file  . Transportation needs:    Medical: Not on file    Non-medical: Not on file  Tobacco Use  . Smoking status: Current Every Day Smoker    Types: Cigarettes  . Smokeless tobacco: Never Used  Substance and Sexual Activity  . Alcohol use: Not Currently  . Drug use: Never  . Sexual activity: Not on file  Lifestyle  . Physical activity:    Days per week: Not on file    Minutes per session: Not on file  . Stress: Not on file  Relationships  . Social connections:    Talks on phone: Not on file    Gets together: Not on file    Attends religious service: Not on file    Active member of club or organization: Not on file    Attends meetings of clubs or organizations: Not on file    Relationship status: Not on file  . Intimate partner violence:    Fear of current or ex partner: Not on file    Emotionally abused: Not on file    Physically abused: Not on file    Forced sexual activity: Not on file  Other Topics Concern  . Not on file  Social History Narrative  . Not on file      ROS:  General: Negative for anorexia, weight loss, fever, chills, fatigue, weakness. Eyes: Negative for vision changes.  ENT: Negative for hoarseness,  nasal congestion. CV: Negative for chest pain, angina, palpitations, dyspnea on exertion, peripheral edema.  Respiratory: Negative for dyspnea at rest, dyspnea on exertion, cough,  sputum, wheezing.  GI: See history of present illness. GU:  Negative for dysuria, hematuria, urinary incontinence, urinary frequency, nocturnal urination.  MS: Negative for joint pain, ++low back pain.  Derm: Negative for rash or itching.  Neuro: Negative for weakness, abnormal sensation, seizure, frequent headaches, memory loss, confusion.  Psych: Negative for anxiety, depression, suicidal ideation, hallucinations.  Endo: Negative for unusual weight change.  Heme: Negative for bruising or bleeding. Allergy: Negative for rash or hives.    Physical Examination:  BP 108/74   Pulse (!) 108   Temp (!) 97.2 F (36.2 C) (Oral)   Ht 5\' 2"  (1.575 m)  Wt 162 lb 12.8 oz (73.8 kg)   BMI 29.78 kg/m    General: Well-nourished, well-developed in no acute distress.  Head: Normocephalic, atraumatic.   Eyes: Conjunctiva pink, no icterus. Mouth: Oropharyngeal mucosa moist and pink , no lesions erythema or exudate. Neck: Supple without thyromegaly, masses, or lymphadenopathy.  Lungs: Clear to auscultation bilaterally.  Heart: Regular rate and rhythm, no murmurs rubs or gallops.  Abdomen: Bowel sounds are normal, nontender, nondistended, no hepatosplenomegaly or masses, no abdominal bruits or    hernia , no rebound or guarding.   Rectal: not performed Extremities: No lower extremity edema. No clubbing or deformities.  Neuro: Alert and oriented x 4 , grossly normal neurologically.  Skin: Warm and dry, no rash or jaundice.   Psych: Alert and cooperative, normal mood and affect.  Labs: Lab Results  Component Value Date   CREATININE 0.73 02/08/2018   BUN 16 02/08/2018   NA 137 02/08/2018   K 3.5 02/08/2018   CL 101 02/08/2018   CO2 27 02/08/2018   Lab Results  Component Value Date   ALT 17 02/08/2018   AST 23 02/08/2018   ALKPHOS 72 02/08/2018   BILITOT 0.8 02/08/2018   Lab Results  Component Value Date   WBC 7.2 02/08/2018   HGB 12.4 02/08/2018   HCT 37.3 02/08/2018   MCV 89.7  02/08/2018   PLT 257 02/08/2018    Lab Results  Component Value Date   LIPASE 26 02/04/2018    Imaging Studies: No results found.

## 2018-06-07 NOTE — Telephone Encounter (Signed)
LMOVM for pt. Pre-op scheduled for 06/13/18 at 9:00am.

## 2018-06-08 NOTE — Telephone Encounter (Signed)
Spoke with patient and is aware of pre-op appt.

## 2018-06-11 ENCOUNTER — Encounter: Payer: Self-pay | Admitting: Gastroenterology

## 2018-06-11 NOTE — Assessment & Plan Note (Signed)
62 y/o female with chronic liquid dysphagia, abnormal esophagus on CT presenting for further management. Would recommend EGD/ED with deep sedation in the near future.  I have discussed the risks, alternatives, benefits with regards to but not limited to the risk of reaction to medication, bleeding, infection, perforation and the patient is agreeable to proceed. Written consent to be obtained.

## 2018-06-11 NOTE — Assessment & Plan Note (Signed)
Episodes dating back to 2015. Subsequently had cholecystectomy. She had EUS in 2016 as outlined above. Per patient she was advised her pancreatitis may have been medication related. She had another episode last month. Trying to obtain records from [facility]. Further recommendations to follow.

## 2018-06-12 ENCOUNTER — Encounter (HOSPITAL_COMMUNITY): Payer: Self-pay

## 2018-06-12 NOTE — Progress Notes (Signed)
Received records from September 2019 hospitalization at [facility].  Abdominal ultrasound May 18, 2018, common bile duct 4 mm, status post cholecystectomy, otherwise unremarkable.  Pancreas not well seen.  CT abdomen pelvis without contrast May 03, 2018, diffuse thickening of the mucosa of the distal esophagus, no hiatal hernia, no discrete mass.  Enlargement of the head of the pancreas as compared to prior study back in June.  Inpatient labs: Lipase initially 2600 at time of discharge down to 81.  On admission white blood cell count 15,000, hemoglobin 15.6, third day hospitalization white count down to 10,000, hemoglobin 11.7 normal, on September 20.  LFTs on admission normal except for alk phos of 116, by discharge on September 20 her alk phos was normal at 70.  LFTs remain normal.  Triglycerides were 230.  Calcium normal.  hpylori negative on 05/03/09   AWAIT EGD. MAY CONSIDER EUS TO FURTHER EVALUATE PANCREAS

## 2018-06-12 NOTE — Progress Notes (Signed)
cc'd to pcp 

## 2018-06-13 ENCOUNTER — Encounter (HOSPITAL_COMMUNITY)
Admission: RE | Admit: 2018-06-13 | Discharge: 2018-06-13 | Disposition: A | Discharge status: Home / Self Care | Payer: Medicare Other | Source: Ambulatory Visit | Attending: Internal Medicine | Admitting: Internal Medicine

## 2018-06-15 ENCOUNTER — Ambulatory Visit (HOSPITAL_COMMUNITY): Payer: Medicare Other | Admitting: Anesthesiology

## 2018-06-15 ENCOUNTER — Ambulatory Visit (HOSPITAL_COMMUNITY)
Admission: RE | Admit: 2018-06-15 | Discharge: 2018-06-15 | Disposition: A | Discharge status: Home / Self Care | Payer: Medicare Other | Source: Ambulatory Visit | Attending: Internal Medicine | Admitting: Internal Medicine

## 2018-06-15 ENCOUNTER — Encounter (HOSPITAL_COMMUNITY): Payer: Self-pay | Admitting: *Deleted

## 2018-06-15 ENCOUNTER — Other Ambulatory Visit: Payer: Self-pay

## 2018-06-15 ENCOUNTER — Encounter (HOSPITAL_COMMUNITY)
Admission: RE | Disposition: A | Discharge status: Home / Self Care | Payer: Self-pay | Source: Ambulatory Visit | Attending: Internal Medicine

## 2018-06-15 DIAGNOSIS — I10 Essential (primary) hypertension: Secondary | ICD-10-CM | POA: Diagnosis not present

## 2018-06-15 DIAGNOSIS — Z7984 Long term (current) use of oral hypoglycemic drugs: Secondary | ICD-10-CM | POA: Diagnosis not present

## 2018-06-15 DIAGNOSIS — F419 Anxiety disorder, unspecified: Secondary | ICD-10-CM | POA: Insufficient documentation

## 2018-06-15 DIAGNOSIS — F17210 Nicotine dependence, cigarettes, uncomplicated: Secondary | ICD-10-CM | POA: Insufficient documentation

## 2018-06-15 DIAGNOSIS — K222 Esophageal obstruction: Secondary | ICD-10-CM | POA: Diagnosis not present

## 2018-06-15 DIAGNOSIS — Z91041 Radiographic dye allergy status: Secondary | ICD-10-CM | POA: Diagnosis not present

## 2018-06-15 DIAGNOSIS — R1319 Other dysphagia: Secondary | ICD-10-CM

## 2018-06-15 DIAGNOSIS — Z882 Allergy status to sulfonamides status: Secondary | ICD-10-CM | POA: Diagnosis not present

## 2018-06-15 DIAGNOSIS — E119 Type 2 diabetes mellitus without complications: Secondary | ICD-10-CM | POA: Insufficient documentation

## 2018-06-15 DIAGNOSIS — Z888 Allergy status to other drugs, medicaments and biological substances status: Secondary | ICD-10-CM | POA: Diagnosis not present

## 2018-06-15 DIAGNOSIS — Z85038 Personal history of other malignant neoplasm of large intestine: Secondary | ICD-10-CM | POA: Insufficient documentation

## 2018-06-15 DIAGNOSIS — K210 Gastro-esophageal reflux disease with esophagitis: Secondary | ICD-10-CM | POA: Insufficient documentation

## 2018-06-15 DIAGNOSIS — R933 Abnormal findings on diagnostic imaging of other parts of digestive tract: Secondary | ICD-10-CM

## 2018-06-15 DIAGNOSIS — Z79899 Other long term (current) drug therapy: Secondary | ICD-10-CM | POA: Insufficient documentation

## 2018-06-15 DIAGNOSIS — R1310 Dysphagia, unspecified: Secondary | ICD-10-CM

## 2018-06-15 DIAGNOSIS — Z9049 Acquired absence of other specified parts of digestive tract: Secondary | ICD-10-CM | POA: Diagnosis not present

## 2018-06-15 DIAGNOSIS — Z7982 Long term (current) use of aspirin: Secondary | ICD-10-CM | POA: Insufficient documentation

## 2018-06-15 HISTORY — DX: Unspecified osteoarthritis, unspecified site: M19.90

## 2018-06-15 HISTORY — DX: Headache, unspecified: R51.9

## 2018-06-15 HISTORY — DX: Headache: R51

## 2018-06-15 HISTORY — PX: MALONEY DILATION: SHX5535

## 2018-06-15 HISTORY — PX: ESOPHAGOGASTRODUODENOSCOPY (EGD) WITH PROPOFOL: SHX5813

## 2018-06-15 LAB — GLUCOSE, CAPILLARY
GLUCOSE-CAPILLARY: 236 mg/dL — AB (ref 70–99)
GLUCOSE-CAPILLARY: 229 mg/dL — AB (ref 70–99)

## 2018-06-15 SURGERY — ESOPHAGOGASTRODUODENOSCOPY (EGD) WITH PROPOFOL
Anesthesia: Monitor Anesthesia Care

## 2018-06-15 MED ORDER — LACTATED RINGERS IV SOLN
INTRAVENOUS | Status: DC
  Administered 2018-06-15: 1000 mL via INTRAVENOUS

## 2018-06-15 MED ORDER — PROPOFOL 10 MG/ML IV BOLUS
INTRAVENOUS | Status: DC | PRN
  Administered 2018-06-15: 20 mg via INTRAVENOUS

## 2018-06-15 MED ORDER — PROPOFOL 500 MG/50ML IV EMUL
INTRAVENOUS | Status: DC | PRN
  Administered 2018-06-15: 150 ug/kg/min via INTRAVENOUS

## 2018-06-15 MED ORDER — CHLORHEXIDINE GLUCONATE CLOTH 2 % EX PADS: 6.0000 | MEDICATED_PAD | Freq: Once | CUTANEOUS | Status: DC

## 2018-06-15 NOTE — Interval H&P Note (Signed)
History and Physical Interval Note:  06/15/2018 8:07 AM  Candice Mccann  has presented today for surgery, with the diagnosis of dysphagia, abnormal esophagus on CT  The various methods of treatment have been discussed with the patient and family. After consideration of risks, benefits and other options for treatment, the patient has consented to  Procedure(s) with comments: ESOPHAGOGASTRODUODENOSCOPY (EGD) WITH PROPOFOL (N/A) - 8:15am MALONEY DILATION (N/A) as a surgical intervention .  The patient's history has been reviewed, patient examined, no change in status, stable for surgery.  I have reviewed the patient's chart and labs.  Questions were answered to the patient's satisfaction.     Candice Mccann  No change.  EGD with possible ED as feasible/appropriate per plan.  The risks, benefits, limitations, alternatives and imponderables have been reviewed with the patient. Potential for esophageal dilation, biopsy, etc. have also been reviewed.  Questions have been answered. All parties agreeable.

## 2018-06-15 NOTE — Op Note (Signed)
[facility] Patient Name: Candice Mccann Procedure Date: 06/15/2018 8:03 AM MRN: [id number] Date of Birth: 1956-05-30 Attending MD: Norvel Richards , MD CSN: [id number] Age: 62 Admit Type: Outpatient Procedure:                Upper GI endoscopy Indications:              Dysphagia Providers:                Norvel Richards, MD, Jeanann Lewandowsky. Sharon Seller, RN,                            Aram Candela Referring MD:              Medicines:                Propofol per Anesthesia Complications:            No immediate complications. Estimated Blood Loss:     Estimated blood loss was minimal. Procedure:                Pre-Anesthesia Assessment:                           - Prior to the procedure, a History and Physical                            was performed, and patient medications and                            allergies were reviewed. The patient's tolerance of                            previous anesthesia was also reviewed. The risks                            and benefits of the procedure and the sedation                            options and risks were discussed with the patient.                            All questions were answered, and informed consent                            was obtained. Prior Anticoagulants: The patient has                            taken no previous anticoagulant or antiplatelet                            agents. ASA Grade Assessment: II - A patient with                            mild systemic disease. After reviewing the risks  and benefits, the patient was deemed in                            satisfactory condition to undergo the procedure.                           After obtaining informed consent, the endoscope was                            passed under direct vision. Throughout the                            procedure, the patient's blood pressure, pulse, and                            oxygen saturations were monitored  continuously. The                            GIF-H190 ([id number]) scope was introduced through the                            and advanced to the second part of duodenum. The                            upper GI endoscopy was accomplished without                            difficulty. The upper GI endoscopy was accomplished                            without difficulty. The patient tolerated the                            procedure well. Scope In: 8:13:54 AM Scope Out: 8:20:01 AM Total Procedure Duration: 0 hours 6 minutes 7 seconds  Findings:      A moderate Schatzki ring was found at the gastroesophageal junction.       Mild reflux esophagitis present. No mass. No Barrett's epithelium seen.      The entire examined stomach was normal.      The duodenal bulb and second portion of the duodenum were normal. The       scope was withdrawn. Dilation was performed with a Maloney dilator with       moderate resistance at 56 Fr. The dilation site was examined following       endoscope reinsertion and showed moderate mucosal disruption. Estimated       blood loss was minimal. Impression:               - Moderate Schatzki ring. Dilated. Mild reflux                            esophagitis.                           - Normal stomach.                           -  Normal duodenal bulb and second portion of the                            duodenum.                           - No specimens collected. Moderate Sedation:      Moderate (conscious) sedation was personally administered by an       anesthesia professional. The following parameters were monitored: oxygen       saturation, heart rate, blood pressure, respiratory rate, EKG, adequacy       of pulmonary ventilation, and response to care. Total physician       intraservice time was 12 minutes. Recommendation:           - Patient has a contact number available for                            emergencies. The signs and symptoms of potential                             delayed complications were discussed with the                            patient. Return to normal activities tomorrow.                            Written discharge instructions were provided to the                            patient.                           - Resume previous diet.                           - Continue present medications. Begin Protonix 40                            mg daily                           - No repeat upper endoscopy.                           - Return to GI office in 3 months. Procedure Code(s):        --- Professional ---                           [phone], Esophagogastroduodenoscopy, flexible,                            transoral; diagnostic, including collection of                            specimen(s) by brushing or washing, when performed                            (  separate procedure)                           43450, Dilation of esophagus, by unguided sound or                            bougie, single or multiple passes Diagnosis Code(s):        --- Professional ---                           K22.2, Esophageal obstruction                           R13.10, Dysphagia, unspecified CPT copyright 2018 American Medical Association. All rights reserved. The codes documented in this report are preliminary and upon coder review may  be revised to meet current compliance requirements. Cristopher Estimable. Sonia Stickels, MD Norvel Richards, MD 06/15/2018 9:02:01 AM This report has been signed electronically. Number of Addenda: 0

## 2018-06-15 NOTE — Anesthesia Procedure Notes (Signed)
Procedure Name: MAC Date/Time: 06/15/2018 8:07 AM Performed by: Vista Deck, CRNA Pre-anesthesia Checklist: Patient identified, Emergency Drugs available, Suction available, Timeout performed and Patient being monitored Patient Re-evaluated:Patient Re-evaluated prior to induction Oxygen Delivery Method: Nasal Cannula

## 2018-06-15 NOTE — Anesthesia Postprocedure Evaluation (Signed)
Anesthesia Post Note  Patient: Candice Mccann  Procedure(s) Performed: ESOPHAGOGASTRODUODENOSCOPY (EGD) WITH PROPOFOL (N/A ) MALONEY DILATION (N/A )  Patient location during evaluation: PACU Anesthesia Type: MAC Level of consciousness: awake and alert and patient cooperative Pain management: satisfactory to patient Vital Signs Assessment: post-procedure vital signs reviewed and stable Respiratory status: spontaneous breathing Cardiovascular status: stable Postop Assessment: no apparent nausea or vomiting Anesthetic complications: no     Last Vitals:  Vitals:   06/15/18 0700 06/15/18 0830  BP: 121/79 117/70  Pulse: 90 88  Resp: 19 (!) 29  Temp: 36.8 C (P) 36.7 C  SpO2: 96% 95%    Last Pain:  Vitals:   06/15/18 0809  TempSrc:   PainSc: 9                  Peng Thorstenson

## 2018-06-15 NOTE — Transfer of Care (Signed)
Immediate Anesthesia Transfer of Care Note  Patient: Chrysten Woulfe  Procedure(s) Performed: ESOPHAGOGASTRODUODENOSCOPY (EGD) WITH PROPOFOL (N/A ) MALONEY DILATION (N/A )  Patient Location: PACU  Anesthesia Type:MAC  Level of Consciousness: awake and patient cooperative  Airway & Oxygen Therapy: Patient Spontanous Breathing  Post-op Assessment: Report given to RN and Post -op Vital signs reviewed and stable  Post vital signs: Reviewed and stable  Last Vitals:  Vitals Value Taken Time  BP 115/68 06/15/2018  8:26 AM  Temp    Pulse    Resp 28 06/15/2018  8:26 AM  SpO2    Vitals shown include unvalidated device data.  Last Pain:  Vitals:   06/15/18 0809  TempSrc:   PainSc: 9          Complications: No apparent anesthesia complications

## 2018-06-15 NOTE — Interval H&P Note (Signed)
History and Physical Interval Note:  06/15/2018 7:27 AM  Candice Mccann  has presented today for surgery, with the diagnosis of dysphagia, abnormal esophagus on CT  The various methods of treatment have been discussed with the patient and family. After consideration of risks, benefits and other options for treatment, the patient has consented to  Procedure(s) with comments: ESOPHAGOGASTRODUODENOSCOPY (EGD) WITH PROPOFOL (N/A) - 8:15am MALONEY DILATION (N/A) as a surgical intervention .  The patient's history has been reviewed, patient examined, no change in status, stable for surgery.  I have reviewed the patient's chart and labs.  Questions were answered to the patient's satisfaction.     Alric Geise  No change.  EGD with potential esophageal dilation as feasible/appropriate per plan.  The risks, benefits, limitations, alternatives and imponderables have been reviewed with the patient. Potential for esophageal dilation, biopsy, etc. have also been reviewed.  Questions have been answered. All parties agreeable.

## 2018-06-15 NOTE — Discharge Instructions (Signed)
EGD Discharge instructions Please read the instructions outlined below and refer to this sheet in the next few weeks. These discharge instructions provide you with general information on caring for yourself after you leave the hospital. Your doctor may also give you specific instructions. While your treatment has been planned according to the most current medical practices available, unavoidable complications occasionally occur. If you have any problems or questions after discharge, please call your doctor. ACTIVITY  You may resume your regular activity but move at a slower pace for the next 24 hours.   Take frequent rest periods for the next 24 hours.   Walking will help expel (get rid of) the air and reduce the bloated feeling in your abdomen.   No driving for 24 hours (because of the anesthesia (medicine) used during the test).   You may shower.   Do not sign any important legal documents or operate any machinery for 24 hours (because of the anesthesia used during the test).  NUTRITION  Drink plenty of fluids.   You may resume your normal diet.   Begin with a light meal and progress to your normal diet.   Avoid alcoholic beverages for 24 hours or as instructed by your caregiver.  MEDICATIONS  You may resume your normal medications unless your caregiver tells you otherwise.  WHAT YOU CAN EXPECT TODAY  You may experience abdominal discomfort such as a feeling of fullness or gas pains.  FOLLOW-UP  Your doctor will discuss the results of your test with you.  SEEK IMMEDIATE MEDICAL ATTENTION IF ANY OF THE FOLLOWING OCCUR:  Excessive nausea (feeling sick to your stomach) and/or vomiting.   Severe abdominal pain and distention (swelling).   Trouble swallowing.   Temperature over 101 F (37.8 C).   Rectal bleeding or vomiting of blood.    GERD information provided  Begin Protonix 40 mg daily  Office visit with [place] in 3 months.     Gastroesophageal Reflux Disease,  Adult Normally, food travels down the esophagus and stays in the stomach to be digested. If a person has gastroesophageal reflux disease (GERD), food and stomach acid move back up into the esophagus. When this happens, the esophagus becomes sore and swollen (inflamed). Over time, GERD can make small holes (ulcers) in the lining of the esophagus. Follow these instructions at home: Diet  Follow a diet as told by your doctor. You may need to avoid foods and drinks such as: ? Coffee and tea (with or without caffeine). ? Drinks that contain alcohol. ? Energy drinks and sports drinks. ? Carbonated drinks or sodas. ? Chocolate and cocoa. ? Peppermint and mint flavorings. ? Garlic and onions. ? Horseradish. ? Spicy and acidic foods, such as peppers, chili powder, curry powder, vinegar, hot sauces, and BBQ sauce. ? Citrus fruit juices and citrus fruits, such as oranges, lemons, and limes. ? Tomato-based foods, such as red sauce, chili, salsa, and pizza with red sauce. ? Fried and fatty foods, such as donuts, french fries, potato chips, and high-fat dressings. ? High-fat meats, such as hot dogs, rib eye steak, sausage, ham, and bacon. ? High-fat dairy items, such as whole milk, butter, and cream cheese.  Eat small meals often. Avoid eating large meals.  Avoid drinking large amounts of liquid with your meals.  Avoid eating meals during the 2-3 hours before bedtime.  Avoid lying down right after you eat.  Do not exercise right after you eat. General instructions  Pay attention to any changes in your  symptoms.  Take over-the-counter and prescription medicines only as told by your doctor. Do not take aspirin, ibuprofen, or other NSAIDs unless your doctor says it is okay.  Do not use any tobacco products, including cigarettes, chewing tobacco, and e-cigarettes. If you need help quitting, ask your doctor.  Wear loose clothes. Do not wear anything tight around your waist.  Raise (elevate) the  head of your bed about 6 inches (15 cm).  Try to lower your stress. If you need help doing this, ask your doctor.  If you are overweight, lose an amount of weight that is healthy for you. Ask your doctor about a safe weight loss goal.  Keep all follow-up visits as told by your doctor. This is important. Contact a doctor if:  You have new symptoms.  You lose weight and you do not know why it is happening.  You have trouble swallowing, or it hurts to swallow.  You have wheezing or a cough that keeps happening.  Your symptoms do not get better with treatment.  You have a hoarse voice. Get help right away if:  You have pain in your arms, neck, jaw, teeth, or back.  You feel sweaty, dizzy, or light-headed.  You have chest pain or shortness of breath.  You throw up (vomit) and your throw up looks like blood or coffee grounds.  You pass out (faint).  Your poop (stool) is bloody or black.  You cannot swallow, drink, or eat. This information is not intended to replace advice given to you by your health care provider. Make sure you discuss any questions you have with your health care provider. Document Released: 02/02/2008 Document Revised: 01/22/2016 Document Reviewed: 12/11/2014 Elsevier Interactive Patient Education  2018 [employer], Care After These instructions provide you with information about caring for yourself after your procedure. Your health care provider may also give you more specific instructions. Your treatment has been planned according to current medical practices, but problems sometimes occur. Call your health care provider if you have any problems or questions after your procedure. What can I expect after the procedure? After your procedure, it is common to:  Feel sleepy for several hours.  Feel clumsy and have poor balance for several hours.  Feel forgetful about what happened after the procedure.  Have poor judgment  for several hours.  Feel nauseous or vomit.  Have a sore throat if you had a breathing tube during the procedure.  Follow these instructions at home: For at least 24 hours after the procedure:   Do not: ? Participate in activities in which you could fall or become injured. ? Drive. ? Use heavy machinery. ? Drink alcohol. ? Take sleeping pills or medicines that cause drowsiness. ? Make important decisions or sign legal documents. ? Take care of children on your own.  Rest. Eating and drinking  Follow the diet that is recommended by your health care provider.  If you vomit, drink water, juice, or soup when you can drink without vomiting.  Make sure you have little or no nausea before eating solid foods. General instructions  Have a responsible adult stay with you until you are awake and alert.  Take over-the-counter and prescription medicines only as told by your health care provider.  If you smoke, do not smoke without supervision.  Keep all follow-up visits as told by your health care provider. This is important. Contact a health care provider if:  You keep feeling nauseous  or you keep vomiting.  You feel light-headed.  You develop a rash.  You have a fever. Get help right away if:  You have trouble breathing. This information is not intended to replace advice given to you by your health care provider. Make sure you discuss any questions you have with your health care provider. Document Released: 12/07/2015 Document Revised: 04/07/2016 Document Reviewed: 12/07/2015 Elsevier Interactive Patient Education  [employer].

## 2018-06-15 NOTE — Anesthesia Preprocedure Evaluation (Signed)
Anesthesia Evaluation  Patient identified by MRN, date of birth, ID band Patient awake    Reviewed: Allergy & Precautions, H&P , NPO status , Patient's Chart, lab work & pertinent test results, reviewed documented beta blocker date and time   Airway Mallampati: I  TM Distance: >3 FB Neck ROM: full    Dental no notable dental hx. (+) Edentulous Upper, Partial Lower, Poor Dentition, Missing   Pulmonary neg pulmonary ROS, Current Smoker,    Pulmonary exam normal breath sounds clear to auscultation       Cardiovascular Exercise Tolerance: Good hypertension, negative cardio ROS   Rhythm:regular Rate:Normal     Neuro/Psych  Headaches, PSYCHIATRIC DISORDERS Anxiety    GI/Hepatic Neg liver ROS, History of colon cancer   Endo/Other  negative endocrine ROSdiabetes  Renal/GU negative Renal ROS  negative genitourinary   Musculoskeletal   Abdominal   Peds  Hematology negative hematology ROS (+)   Anesthesia Other Findings   Reproductive/Obstetrics negative OB ROS                             Anesthesia Physical Anesthesia Plan  ASA: III  Anesthesia Plan: MAC   Post-op Pain Management:    Induction:   PONV Risk Score and Plan:   Airway Management Planned:   Additional Equipment:   Intra-op Plan:   Post-operative Plan:   Informed Consent: I have reviewed the patients History and Physical, chart, labs and discussed the procedure including the risks, benefits and alternatives for the proposed anesthesia with the patient or authorized representative who has indicated his/her understanding and acceptance.   Dental Advisory Given  Plan Discussed with: CRNA  Anesthesia Plan Comments:         Anesthesia Quick Evaluation

## 2018-06-21 ENCOUNTER — Encounter (HOSPITAL_COMMUNITY): Payer: Self-pay | Admitting: Internal Medicine

## 2018-09-25 ENCOUNTER — Ambulatory Visit: Payer: Medicare Other | Admitting: Gastroenterology

## 2019-01-04 ENCOUNTER — Other Ambulatory Visit: Payer: Self-pay

## 2019-01-04 ENCOUNTER — Ambulatory Visit (INDEPENDENT_AMBULATORY_CARE_PROVIDER_SITE_OTHER): Payer: Medicare Other | Admitting: Gastroenterology

## 2019-01-04 ENCOUNTER — Encounter: Payer: Self-pay | Admitting: Gastroenterology

## 2019-01-04 DIAGNOSIS — R1319 Other dysphagia: Secondary | ICD-10-CM

## 2019-01-04 DIAGNOSIS — R1310 Dysphagia, unspecified: Secondary | ICD-10-CM

## 2019-01-04 DIAGNOSIS — K861 Other chronic pancreatitis: Secondary | ICD-10-CM

## 2019-01-04 MED ORDER — PANTOPRAZOLE SODIUM 40 MG PO TBEC: 40.0000 mg | DELAYED_RELEASE_TABLET | Freq: Every day | ORAL | 5 refills | Status: DC

## 2019-01-04 NOTE — Patient Instructions (Signed)
1. Start pantoprazole once daily before breakfast for acid reflux.  2. We will plan on checking your swallowing out better once COVID 19 pandemic has improved. We will schedule you for a swallowing xray sometime in 01/2019.  3. We will be in touch regarding your pancreas issues.

## 2019-01-04 NOTE — Progress Notes (Signed)
Primary Care Physician:  Candice Mealy, MD Primary GI:  Candice Cornea, MD   Patient Location: Home  Provider Location: [facility] office  Reason for Phone Visit: Follow-up EGD, dysphagia, idiopathic pancreatitis  Persons present on the phone encounter, with roles: Patient, myself (provider), Candice Mccann, CMA (updated meds and allergies)  Total time (minutes) spent on medical discussion: 25 minutes  Due to COVID-19, visit was conducted using telephonic method (no video was available).  Visit was requested by patient.  Virtual Visit via Telephone only  I connected with  [name] on 01/04/19 at  1:30 PM EDT by telephone and verified that I am speaking with the correct person using two identifiers.   I discussed the limitations, risks, security and privacy concerns of performing an evaluation and management service by telephone and the availability of in person appointments. I also discussed with the patient that there may be a patient responsible charge related to this service. The patient expressed understanding and agreed to proceed.   HPI:   Patient is a pleasant 63 year old female who presents for telephone visit regarding follow-up of dysphagia, pancreatitis.  Patient has a history of idiopathic chronic pancreatitis and esophageal wall thickening.  She underwent an EGD in October 2019 showing erosive reflux esophagitis (mild), Schatzki ring status post dilation.  CT abdomen pelvis without contrast September 2019 at [facility] showed esophageal wall circumferential thickening, possible esophagitis.  Pancreatic head remain prominent but with peripancreatic exudate present extending into the left pericolic gutter and strandiness throughout the peripancreatic fat planes consistent with acute pancreatitis.  Her lipase was over 2000.  Her HCTZ was stopped initially.  She had an episode back several years back, EUS in 2016,Candice Mccann, presumed biliary etiology of pancreatitis. Pancreatic  parenchyma diffusely increased in heterogeneity throughout with worsened severity within the head region at the confluence of the bile and pancreatic duct. The head region also contained hyperechoic linear stranding forming lobularity. Candice Mccann recommended six month f/u CT. Per patient, Candice Mccann said pancreatitis brought on by pill she was on back then, it was stopped. She cannot recall the name.    She also has a history of colon cancer in 2009 status post resection, no chemo or radiation.  Last colonoscopy February 2019 with Candice Mccann, reported to be normal.  Initially EGD/ED helped dysphagia. But slowing coming back now. Happens more with fluids. Can go down and come back up through nose. Solid foods getting stuck again in lower part of chest. If lays down in the evening on left side, has regurgitation. No bitter taste. No eating/drinking for two hours before laying down. No heartburn. Not on PPI. BM moving good. No melena, brbpr. Some right upper quadrant discomfort, feels like with pancreatitis in the past.  Low-fat diet.  No vomiting.  Drinking plenty of fluids.  She believes she has lost some weight.  She states she weighed 160 pounds at her PCP 1 month ago but believes she is lower than that now based on her clothes fit.  Last documented weight I have is 162 pounds in October 2019.      Current Outpatient Medications  Medication Sig Dispense Refill  . ALPRAZolam (XANAX) 1 MG tablet Take 1 mg by mouth at bedtime.     Marland Kitchen aspirin 325 MG EC tablet Take 325 mg by mouth every evening.     . cholestyramine (QUESTRAN) 4 g packet Take 4 g by mouth daily as needed.    . cloNIDine (  CATAPRES) 0.1 MG tablet Take 0.1 mg by mouth daily.    . Difluprednate (DUREZOL) 0.05 % EMUL Place 1 drop into both eyes daily as needed (dry eyes).    . diphenhydramine-acetaminophen (TYLENOL PM) 25-500 MG TABS tablet Take 2 tablets by mouth at bedtime as needed. (Patient taking differently: Take 2 tablets by mouth at  bedtime. ) 10 tablet 0  . glipiZIDE (GLUCOTROL) 10 MG tablet Take 10 mg by mouth 2 (two) times daily before a meal.    . HYDROcodone-acetaminophen (NORCO) 10-325 MG tablet Take 1 tablet by mouth every 6 (six) hours as needed for severe pain.     Marland Kitchen lisinopril-hydrochlorothiazide (PRINZIDE,ZESTORETIC) 20-12.5 MG tablet Take 1 tablet by mouth daily.    . metFORMIN (GLUCOPHAGE) 1000 MG tablet Take 1,000 mg by mouth 2 (two) times daily with a meal.     . metoprolol tartrate (LOPRESSOR) 25 MG tablet Take 1 tablet (25 mg total) by mouth 2 (two) times daily. (Patient taking differently: Take 25 mg by mouth every morning. ) 60 tablet 0  . potassium chloride SA (K-DUR,KLOR-CON) 20 MEQ tablet Take 20 mEq by mouth daily.    . simvastatin (ZOCOR) 40 MG tablet Take 40 mg by mouth every evening.     Marland Kitchen tiZANidine (ZANAFLEX) 4 MG capsule Take 1 capsule (4 mg total) by mouth at bedtime as needed for muscle spasms. (Patient taking differently: Take 12 mg by mouth at bedtime. ) 10 capsule 0  . triamcinolone cream (KENALOG) 0.1 % Apply 1 application topically daily as needed for rash.    . vitamin B-12 (CYANOCOBALAMIN) 1000 MCG tablet Take 1,000 mcg by mouth daily.     No current facility-administered medications for this visit.     Past Medical History:  Diagnosis Date  . Anxiety   . Arthritis    back  . Colon cancer ([place])   . Diabetes ([place])   . Headache   . Hypertension     Past Surgical History:  Procedure Laterality Date  . ABDOMINAL HYSTERECTOMY    . BACK SURGERY    . CHOLECYSTECTOMY    . COLON SURGERY  2009   colon cancer  . COLONOSCOPY  09/2017   Dr. Burke Keels: Normal colonoscopy  . ESOPHAGOGASTRODUODENOSCOPY (EGD) WITH PROPOFOL N/A 06/15/2018   Dr. Gala Romney: schatzki ring s/p dilation. mild reflux esophagitis.   . EUS  2016   Candice Mccann   . [place] N/A 06/15/2018   Procedure: Venia Minks DILATION;  Surgeon: Daneil Dolin, MD;  Location: [facility];  Service:  Endoscopy;  Laterality: N/A;    History reviewed. No pertinent family history.  Social History   Socioeconomic History  . Marital status: Married    Spouse name: Not on file  . Number of children: Not on file  . Years of education: Not on file  . Highest education level: Not on file  Occupational History  . Not on file  Social Needs  . Financial resource strain: Not on file  . Food insecurity:    Worry: Not on file    Inability: Not on file  . Transportation needs:    Medical: Not on file    Non-medical: Not on file  Tobacco Use  . Smoking status: Current Every Day Smoker    Types: Cigarettes  . Smokeless tobacco: Never Used  Substance and Sexual Activity  . Alcohol use: Not Currently  . Drug use: Never  . Sexual activity: Not on  file  Lifestyle  . Physical activity:    Days per week: Not on file    Minutes per session: Not on file  . Stress: Not on file  Relationships  . Social connections:    Talks on phone: Not on file    Gets together: Not on file    Attends religious service: Not on file    Active member of club or organization: Not on file    Attends meetings of clubs or organizations: Not on file    Relationship status: Not on file  . Intimate partner violence:    Fear of current or ex partner: Not on file    Emotionally abused: Not on file    Physically abused: Not on file    Forced sexual activity: Not on file  Other Topics Concern  . Not on file  Social History Narrative  . Not on file      ROS:  General: Negative for anorexia,fever, chills, fatigue, weakness.  Patient reports weight loss Eyes: Negative for vision changes.  ENT: Negative for hoarseness,  nasal congestion.  See HPI CV: Negative for chest pain, angina, palpitations, dyspnea on exertion, peripheral edema.  Respiratory: Negative for dyspnea at rest, dyspnea on exertion, cough, sputum, wheezing.  GI: See history of present illness. GU:  Negative for dysuria, hematuria, urinary  incontinence, urinary frequency, nocturnal urination.  MS: Negative for joint pain, low back pain.  Derm: Negative for rash or itching.  Neuro: Negative for weakness, abnormal sensation, seizure, frequent headaches, memory loss, confusion.  Psych: Negative for anxiety, depression, suicidal ideation, hallucinations.  Endo: Negative for unusual weight change.  See HPI Heme: Negative for bruising or bleeding. Allergy: Negative for rash or hives.   Observations/Objective: Pleasant cooperative, no acute distress.  Otherwise exam unavailable.  Assessment and Plan: Pleasant 63 year old female presenting today for follow-up of dysphagia, idiopathic chronic pancreatitis.  She has had some recurrence of dysphagia to both liquids and solids.   EGD with dilation seem to help previously.  She has Schatzki ring requiring dilation.  More recently has had some upper abdominal discomfort which she feels may be related to her history of pancreatitis.  Mild to moderate intermittently.  Able to tolerate liquids and low-fat diet.  She is having some breakthrough reflux predominantly at night.  She is no longer on PPI although had a history of erosive reflux esophagitis.  We will start her on pantoprazole 40 mg daily.  Towards the end of June, plan for barium pill esophagram to evaluate her swallowing.  I will be touching base with Dr. Gala Romney regarding idiopathic pancreatitis.  She may require further imaging.  Further recommendations to follow.  We will also plan to see her back in the office in 3 months.  Follow Up Instructions:    I discussed the assessment and treatment plan with the patient. The patient was provided an opportunity to ask questions and all were answered. The patient agreed with the plan and demonstrated an understanding of the instructions. AVS mailed to patient's home address.   The patient was advised to call back or seek an in-person evaluation if the symptoms worsen or if the condition fails  to improve as anticipated.  I provided 25 minutes of non-face-to-face time during this encounter.   Neil Crouch, PA-C

## 2019-01-08 ENCOUNTER — Telehealth: Payer: Self-pay

## 2019-01-08 ENCOUNTER — Other Ambulatory Visit: Payer: Self-pay

## 2019-01-08 ENCOUNTER — Encounter: Payer: Self-pay | Admitting: Internal Medicine

## 2019-01-08 DIAGNOSIS — R1310 Dysphagia, unspecified: Secondary | ICD-10-CM

## 2019-01-08 DIAGNOSIS — R1319 Other dysphagia: Secondary | ICD-10-CM

## 2019-01-08 NOTE — Telephone Encounter (Signed)
BPE esophagus scheduled for 02/14/19 at 9:30am, arrive at 9:15am. NPO for 3 hours prior to test. Tried to call pt, no answer, LMOAM. Appt letter mailed

## 2019-01-08 NOTE — Progress Notes (Signed)
CC'D TO PCP °

## 2019-02-14 ENCOUNTER — Telehealth: Payer: Self-pay | Admitting: Gastroenterology

## 2019-02-14 ENCOUNTER — Ambulatory Visit (HOSPITAL_COMMUNITY): Payer: Medicare Other

## 2019-02-14 DIAGNOSIS — K861 Other chronic pancreatitis: Secondary | ICD-10-CM

## 2019-02-14 NOTE — Telephone Encounter (Signed)
Please let patient know that RMR recommends MRI for h/o idiopathic pancreatitis.   Recommend MRI Abd with and without contrast Dx: idiopathic pancreatitis,

## 2019-02-15 NOTE — Telephone Encounter (Signed)
MRI scheduled for 6/25 Thursday at 10:00am, arrival time 9:30am, npo 4 hrs prior.  Spoke with patient and she is aware of appt details. She voiced understanding.

## 2019-02-15 NOTE — Addendum Note (Signed)
Addended by: Inge Rise on: 02/15/2019 09:47 AM   Modules accepted: Orders

## 2019-02-15 NOTE — Telephone Encounter (Signed)
Pt notified. Please see LSL's note below. Pt is ready to schedule MRI.

## 2019-02-22 ENCOUNTER — Telehealth: Payer: Self-pay | Admitting: Gastroenterology

## 2019-02-22 ENCOUNTER — Other Ambulatory Visit: Payer: Self-pay | Admitting: Gastroenterology

## 2019-02-22 ENCOUNTER — Other Ambulatory Visit: Payer: Self-pay

## 2019-02-22 ENCOUNTER — Ambulatory Visit (HOSPITAL_COMMUNITY)
Admission: RE | Admit: 2019-02-22 | Discharge: 2019-02-22 | Disposition: A | Discharge status: Home / Self Care | Payer: Medicare Other | Source: Ambulatory Visit | Attending: Gastroenterology | Admitting: Gastroenterology

## 2019-02-22 DIAGNOSIS — K861 Other chronic pancreatitis: Secondary | ICD-10-CM | POA: Diagnosis not present

## 2019-02-22 NOTE — Telephone Encounter (Signed)
Covering for provider who is out of town. Spoke with Candice Mccann in MRI. Patient had anaphylactic reaction to CT contrast. Shane reassured the patient that MRI contrast was different. The patient is declining for now, so MRI will be without contrast. If she is willing and it's necessary, Candice Mccann reported she could have additional imaging WITH contrast in the near future. May need EUS if she is not willing to have contrast in future as this limits exam without contrast.

## 2019-02-23 NOTE — Telephone Encounter (Signed)
noted 

## 2019-03-06 ENCOUNTER — Telehealth: Payer: Self-pay | Admitting: Internal Medicine

## 2019-03-06 NOTE — Telephone Encounter (Signed)
See result note. Pt is aware.  

## 2019-03-06 NOTE — Telephone Encounter (Signed)
Pt called asking to speak with the nurse about her MRI. Please call her at [phone]

## 2019-03-07 ENCOUNTER — Encounter: Payer: Self-pay | Admitting: Internal Medicine

## 2019-03-07 ENCOUNTER — Telehealth: Payer: Self-pay

## 2019-03-07 NOTE — Progress Notes (Signed)
RESCHEDULED TO APPOINTMENT WITH RMR AND MAILED LETTER

## 2019-03-07 NOTE — Telephone Encounter (Signed)
Pt called and said her appt with Dr. Gala Romney is 05/01/2019. She said she has been having this problem for several months of pain on right side under breasts.  She said it is sore to the touch. She has not had any nausea or vomiting.  Her swallowing is better. She wants to know if there is anything she needs to do before the apt with Dr. Gala Romney. Forwarding to Neil Crouch, PA to advise and to AM since this is RMR pt.

## 2019-03-08 ENCOUNTER — Other Ambulatory Visit: Payer: Self-pay

## 2019-03-08 DIAGNOSIS — R1011 Right upper quadrant pain: Secondary | ICD-10-CM

## 2019-03-08 NOTE — Telephone Encounter (Signed)
Let's have her update labs in interim. CBC, CMET, lipase  Continue pantoprazole.

## 2019-03-08 NOTE — Telephone Encounter (Signed)
Forwarding to [place], RMR pt.

## 2019-03-08 NOTE — Telephone Encounter (Signed)
Pt notified that she needs to complete labs. Lab orders released.

## 2019-03-14 LAB — COMPREHENSIVE METABOLIC PANEL
AG Ratio: 1.6 (calc) (ref 1.0–2.5)
ALT: 7 U/L (ref 6–29)
AST: 12 U/L (ref 10–35)
Albumin: 4.2 g/dL (ref 3.6–5.1)
Alkaline phosphatase (APISO): 90 U/L (ref 37–153)
BUN: 15 mg/dL (ref 7–25)
CO2: 30 mmol/L (ref 20–32)
Calcium: 9.9 mg/dL (ref 8.6–10.4)
Chloride: 103 mmol/L (ref 98–110)
Creat: 0.82 mg/dL (ref 0.50–0.99)
Globulin: 2.7 g/dL (calc) (ref 1.9–3.7)
Glucose, Bld: 276 mg/dL — ABNORMAL HIGH (ref 65–139)
Potassium: 4.8 mmol/L (ref 3.5–5.3)
Sodium: 142 mmol/L (ref 135–146)
Total Bilirubin: 0.5 mg/dL (ref 0.2–1.2)
Total Protein: 6.9 g/dL (ref 6.1–8.1)

## 2019-03-14 LAB — CBC WITH DIFFERENTIAL/PLATELET
Absolute Monocytes: 450 cells/uL (ref 200–950)
Basophils Absolute: 23 cells/uL (ref 0–200)
Basophils Relative: 0.3 %
Eosinophils Absolute: 60 cells/uL (ref 15–500)
Eosinophils Relative: 0.8 %
HCT: 39.5 % (ref 35.0–45.0)
Hemoglobin: 13 g/dL (ref 11.7–15.5)
Lymphs Abs: 2490 cells/uL (ref 850–3900)
MCH: 29.3 pg (ref 27.0–33.0)
MCHC: 32.9 g/dL (ref 32.0–36.0)
MCV: 89 fL (ref 80.0–100.0)
MPV: 10.8 fL (ref 7.5–12.5)
Monocytes Relative: 6 %
Neutro Abs: 4478 cells/uL (ref 1500–7800)
Neutrophils Relative %: 59.7 %
Platelets: 290 10*3/uL (ref 140–400)
RBC: 4.44 10*6/uL (ref 3.80–5.10)
RDW: 13.5 % (ref 11.0–15.0)
Total Lymphocyte: 33.2 %
WBC: 7.5 10*3/uL (ref 3.8–10.8)

## 2019-03-14 LAB — LIPASE: Lipase: 17 U/L (ref 7–60)

## 2019-04-10 ENCOUNTER — Ambulatory Visit: Payer: Medicare Other | Admitting: Gastroenterology

## 2019-05-01 ENCOUNTER — Other Ambulatory Visit: Payer: Self-pay | Admitting: *Deleted

## 2019-05-01 ENCOUNTER — Encounter: Payer: Self-pay | Admitting: Internal Medicine

## 2019-05-01 ENCOUNTER — Encounter: Payer: Self-pay | Admitting: *Deleted

## 2019-05-01 ENCOUNTER — Ambulatory Visit (INDEPENDENT_AMBULATORY_CARE_PROVIDER_SITE_OTHER): Payer: Medicare Other | Admitting: Internal Medicine

## 2019-05-01 ENCOUNTER — Other Ambulatory Visit: Payer: Self-pay

## 2019-05-01 VITALS — BP 143/88 | HR 107 | Temp 97.0°F | Ht 62.0 in | Wt 154.0 lb

## 2019-05-01 DIAGNOSIS — R1310 Dysphagia, unspecified: Secondary | ICD-10-CM

## 2019-05-01 DIAGNOSIS — R1319 Other dysphagia: Secondary | ICD-10-CM

## 2019-05-01 NOTE — Progress Notes (Signed)
Primary Care Physician:  Sandi Mealy, MD Primary Gastroenterologist:  Dr. Gala Romney  Pre-Procedure History & Physical: HPI:  Candice Mccann is a 63 y.o. female here for for evaluation of dysphagia.  Has patient has history of Schatzki's ring which is dilated about 1 year ago.  She had significant improvement in her dysphagia symptoms until recently.  Reflux symptoms well controlled on Protonix 40 mg daily.  She has had issues with dizziness ataxia following an extremity weakness for which she went to the emergency department a bit [facility].  Work-up there included MRI of the brain which showed no abnormalities.  She is to see her PCP at the end of this week.  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  She takes cholestyramine intermittently for this emptied with good results.  History of colon cancer status post last surveillance colonoscopy 2019.   Past Medical History:  Diagnosis Date  . Anxiety   . Arthritis    back  . Colon cancer ([place])   . Diabetes ([place])   . Headache   . Hypertension     Past Surgical History:  Procedure Laterality Date  . ABDOMINAL HYSTERECTOMY    . BACK SURGERY    . CHOLECYSTECTOMY    . COLON SURGERY  2009   colon cancer  . COLONOSCOPY  09/2017   Dr. Burke Keels: Normal colonoscopy  . ESOPHAGOGASTRODUODENOSCOPY (EGD) WITH PROPOFOL N/A 06/15/2018   Dr. Gala Romney: schatzki ring s/p dilation. mild reflux esophagitis.   . EUS  2016   Dr. Christoper Fabian   . [place] N/A 06/15/2018   Procedure: Venia Minks DILATION;  Surgeon: Daneil Dolin, MD;  Location: [facility];  Service: Endoscopy;  Laterality: N/A;    Prior to Admission medications   Medication Sig Start Date End Date Taking? Authorizing Provider  ALPRAZolam Duanne Moron) 1 MG tablet Take 1 mg by mouth at bedtime.    Yes [provider]  aspirin 325 MG EC tablet Take 325 mg by mouth every evening.    Yes [provider]  cloNIDine (CATAPRES) 0.1 MG tablet Take 0.1 mg by mouth daily.   Yes [provider]  diphenhydramine-acetaminophen (TYLENOL PM) 25-500 MG TABS tablet Take 2 tablets by mouth at bedtime as needed. Patient taking differently: Take 2 tablets by mouth at bedtime.  02/08/18  Yes Florencia Reasons, MD  glipiZIDE (GLUCOTROL) 10 MG tablet Take 10 mg by mouth 2 (two) times daily before a meal.   Yes [provider]  HYDROcodone-acetaminophen (NORCO) 10-325 MG tablet Take 1 tablet by mouth every 6 (six) hours as needed for severe pain.    Yes [provider]  lisinopril-hydrochlorothiazide (PRINZIDE,ZESTORETIC) 20-12.5 MG tablet Take 1 tablet by mouth daily.   Yes [provider]  metFORMIN (GLUCOPHAGE) 1000 MG tablet Take 1,000 mg by mouth 2 (two) times daily with a meal.    Yes [provider]  metoprolol tartrate (LOPRESSOR) 25 MG tablet Take 1 tablet (25 mg total) by mouth 2 (two) times daily. Patient taking differently: Take 25 mg by mouth every morning.  02/08/18  Yes Florencia Reasons, MD  pantoprazole (PROTONIX) 40 MG tablet Take 1 tablet (40 mg total) by mouth daily before breakfast. 01/04/19  Yes Mahala Menghini, PA-C  polyethylene glycol (MIRALAX / GLYCOLAX) 17 g packet Take 17 g by mouth daily as needed.   Yes [provider]  potassium chloride SA (K-DUR,KLOR-CON)  20 MEQ tablet Take 20 mEq by mouth daily.   Yes [provider]  simvastatin (ZOCOR) 40 MG tablet Take 40 mg by mouth every evening.    Yes [provider]  tiZANidine (ZANAFLEX) 4 MG capsule Take 1 capsule (4 mg total) by mouth at bedtime as needed for muscle spasms. Patient taking differently: Take 12 mg by mouth at bedtime.  02/08/18  Yes Florencia Reasons, MD  triamcinolone cream (KENALOG) 0.1 % Apply 1 application topically daily as needed for rash.   Yes [provider]  vitamin B-12 (CYANOCOBALAMIN) 1000 MCG tablet Take 1,000 mcg by mouth daily.   Yes [provider]    Allergies as of 05/01/2019 - Review Complete 05/01/2019  Allergen Reaction Noted  . Contrast media [iodinated diagnostic agents] Anaphylaxis and Nausea And Vomiting 02/04/2018  . Nitroglycerin Other (See Comments) 02/04/2018  . Tomato Itching 06/15/2018  . Sulfa antibiotics Rash 02/04/2018    No family history on file.  Social History   Socioeconomic History  . Marital status: Married    Spouse name: Not on file  . Number of children: Not on file  . Years of education: Not on file  . Highest education level: Not on file  Occupational History  . Not on file  Social Needs  . Financial resource strain: Not on file  . Food insecurity    Worry: Not on file    Inability: Not on file  . Transportation needs    Medical: Not on file    Non-medical: Not on file  Tobacco Use  . Smoking status: Current Every Day Smoker    Types: Cigarettes  . Smokeless tobacco: Never Used  Substance and Sexual Activity  . Alcohol use: Not Currently  . Drug use: Never  . Sexual activity: Not on file  Lifestyle  . Physical activity    Days per week: Not on file    Minutes per session: Not on file  . Stress: Not on file  Relationships  . Social Herbalist on phone: Not on file    Gets together: Not on file    Attends religious service: Not on file    Active member of club or organization: Not on file    Attends meetings of clubs or organizations: Not on file    Relationship status: Not on file  . Intimate partner violence    Fear of current or ex partner: Not on file    Emotionally abused: Not on file    Physically abused: Not on file    Forced sexual activity: Not on file  Other Topics Concern  . Not on file  Social History Narrative  . Not on file    Review of Systems: See HPI, otherwise negative ROS  Physical Exam: BP (!) 143/88   Pulse (!) 107   Temp (!) 97 F (36.1 C) (Oral)   Ht 5\' 2"  (1.575 m)   Wt 154 lb (69.9 kg)   BMI 28.17 kg/m   General:   Alert,  Well-developed, well-nourished, pleasant and cooperative in NAD Neck:  Supple; no masses or thyromegaly. No significant cervical adenopathy. Lungs:  Clear throughout to auscultation.   No wheezes, crackles, or rhonchi. No acute distress. Heart:  Regular rate and rhythm; no murmurs, clicks, rubs,  or gallops. Abdomen: Non-distended, normal bowel sounds.  Soft and nontender without appreciable mass or hepatosplenomegaly.  Pulses:  Normal pulses noted. Extremities:  Without clubbing or edema.  Impression/Plan: Pleasant 63 year old  lady presents with a history of recurrent esophageal dysphagia in the setting of a known Schatzki's ring.  GERD well-controlled on Protonix.  She would likely benefit from a repeat EGD with dilation in the near future.  Her neurological symptoms are of concern.  We feel mutually agreed further evaluation of the symptoms takes top priority in the near future.  Recommendations: I have offered the patient a EGD with esophageal dilation as feasible/appropriate per plan-we will utilize propofol.The risks, benefits, limitations, alternatives and imponderables have been reviewed with the patient. Potential for esophageal dilation, biopsy, etc. have also been reviewed.  Questions have been answered. All parties agreeable.  She is urged to follow-up with her PCP as scheduled end of this week to further evaluate her, ataxia, weakness, falling  We will schedule EGD in about 3 to 4 weeks.   Notice: This dictation was prepared with Dragon dictation along with smaller phrase technology. Any transcriptional errors that result from this process are unintentional and may not be corrected upon review.

## 2019-05-01 NOTE — H&P (View-Only) (Signed)
Primary Care Physician:  Sandi Mealy, MD Primary Gastroenterologist:  Dr. Gala Romney  Pre-Procedure History & Physical: HPI:  Candice Mccann is a 63 y.o. female here for for evaluation of dysphagia.  Has patient has history of Schatzki's ring which is dilated about 1 year ago.  She had significant improvement in her dysphagia symptoms until recently.  Reflux symptoms well controlled on Protonix 40 mg daily.  She has had issues with dizziness ataxia following an extremity weakness for which she went to the emergency department a bit [facility].  Work-up there included MRI of the brain which showed no abnormalities.  She is to see her PCP at the end of this week.  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  She takes cholestyramine intermittently for this emptied with good results.  History of colon cancer status post last surveillance colonoscopy 2019.   Past Medical History:  Diagnosis Date  . Anxiety   . Arthritis    back  . Colon cancer ([place])   . Diabetes ([place])   . Headache   . Hypertension     Past Surgical History:  Procedure Laterality Date  . ABDOMINAL HYSTERECTOMY    . BACK SURGERY    . CHOLECYSTECTOMY    . COLON SURGERY  2009   colon cancer  . COLONOSCOPY  09/2017   Dr. Burke Keels: Normal colonoscopy  . ESOPHAGOGASTRODUODENOSCOPY (EGD) WITH PROPOFOL N/A 06/15/2018   Dr. Gala Romney: schatzki ring s/p dilation. mild reflux esophagitis.   . EUS  2016   Dr. Christoper Fabian   . [place] N/A 06/15/2018   Procedure: Venia Minks DILATION;  Surgeon: Daneil Dolin, MD;  Location: [facility];  Service: Endoscopy;  Laterality: N/A;    Prior to Admission medications   Medication Sig Start Date End Date Taking? Authorizing Provider  ALPRAZolam Duanne Moron) 1 MG tablet Take 1 mg by mouth at bedtime.    Yes [provider]  aspirin 325 MG EC tablet Take 325 mg by mouth every evening.    Yes [provider]  cloNIDine (CATAPRES) 0.1 MG tablet Take 0.1 mg by mouth daily.   Yes [provider]  diphenhydramine-acetaminophen (TYLENOL PM) 25-500 MG TABS tablet Take 2 tablets by mouth at bedtime as needed. Patient taking differently: Take 2 tablets by mouth at bedtime.  02/08/18  Yes Florencia Reasons, MD  glipiZIDE (GLUCOTROL) 10 MG tablet Take 10 mg by mouth 2 (two) times daily before a meal.   Yes [provider]  HYDROcodone-acetaminophen (NORCO) 10-325 MG tablet Take 1 tablet by mouth every 6 (six) hours as needed for severe pain.    Yes [provider]  lisinopril-hydrochlorothiazide (PRINZIDE,ZESTORETIC) 20-12.5 MG tablet Take 1 tablet by mouth daily.   Yes [provider]  metFORMIN (GLUCOPHAGE) 1000 MG tablet Take 1,000 mg by mouth 2 (two) times daily with a meal.    Yes [provider]  metoprolol tartrate (LOPRESSOR) 25 MG tablet Take 1 tablet (25 mg total) by mouth 2 (two) times daily. Patient taking differently: Take 25 mg by mouth every morning.  02/08/18  Yes Florencia Reasons, MD  pantoprazole (PROTONIX) 40 MG tablet Take 1 tablet (40 mg total) by mouth daily before breakfast. 01/04/19  Yes Mahala Menghini, PA-C  polyethylene glycol (MIRALAX / GLYCOLAX) 17 g packet Take 17 g by mouth daily as needed.   Yes [provider]  potassium chloride SA (K-DUR,KLOR-CON)  20 MEQ tablet Take 20 mEq by mouth daily.   Yes [provider]  simvastatin (ZOCOR) 40 MG tablet Take 40 mg by mouth every evening.    Yes [provider]  tiZANidine (ZANAFLEX) 4 MG capsule Take 1 capsule (4 mg total) by mouth at bedtime as needed for muscle spasms. Patient taking differently: Take 12 mg by mouth at bedtime.  02/08/18  Yes Florencia Reasons, MD  triamcinolone cream (KENALOG) 0.1 % Apply 1 application topically daily as needed for rash.   Yes [provider]  vitamin B-12 (CYANOCOBALAMIN) 1000 MCG tablet Take 1,000 mcg by mouth daily.   Yes [provider]    Allergies as of 05/01/2019 - Review Complete 05/01/2019  Allergen Reaction Noted  . Contrast media [iodinated diagnostic agents] Anaphylaxis and Nausea And Vomiting 02/04/2018  . Nitroglycerin Other (See Comments) 02/04/2018  . Tomato Itching 06/15/2018  . Sulfa antibiotics Rash 02/04/2018    No family history on file.  Social History   Socioeconomic History  . Marital status: Married    Spouse name: Not on file  . Number of children: Not on file  . Years of education: Not on file  . Highest education level: Not on file  Occupational History  . Not on file  Social Needs  . Financial resource strain: Not on file  . Food insecurity    Worry: Not on file    Inability: Not on file  . Transportation needs    Medical: Not on file    Non-medical: Not on file  Tobacco Use  . Smoking status: Current Every Day Smoker    Types: Cigarettes  . Smokeless tobacco: Never Used  Substance and Sexual Activity  . Alcohol use: Not Currently  . Drug use: Never  . Sexual activity: Not on file  Lifestyle  . Physical activity    Days per week: Not on file    Minutes per session: Not on file  . Stress: Not on file  Relationships  . Social Herbalist on phone: Not on file    Gets together: Not on file    Attends religious service: Not on file    Active member of club or organization: Not on file    Attends meetings of clubs or organizations: Not on file    Relationship status: Not on file  . Intimate partner violence    Fear of current or ex partner: Not on file    Emotionally abused: Not on file    Physically abused: Not on file    Forced sexual activity: Not on file  Other Topics Concern  . Not on file  Social History Narrative  . Not on file    Review of Systems: See HPI, otherwise negative ROS  Physical Exam: BP (!) 143/88   Pulse (!) 107   Temp (!) 97 F (36.1 C) (Oral)   Ht 5\' 2"  (1.575 m)   Wt 154 lb (69.9 kg)   BMI 28.17 kg/m   General:   Alert,  Well-developed, well-nourished, pleasant and cooperative in NAD Neck:  Supple; no masses or thyromegaly. No significant cervical adenopathy. Lungs:  Clear throughout to auscultation.   No wheezes, crackles, or rhonchi. No acute distress. Heart:  Regular rate and rhythm; no murmurs, clicks, rubs,  or gallops. Abdomen: Non-distended, normal bowel sounds.  Soft and nontender without appreciable mass or hepatosplenomegaly.  Pulses:  Normal pulses noted. Extremities:  Without clubbing or edema.  Impression/Plan: Pleasant 63 year old  lady presents with a history of recurrent esophageal dysphagia in the setting of a known Schatzki's ring.  GERD well-controlled on Protonix.  She would likely benefit from a repeat EGD with dilation in the near future.  Her neurological symptoms are of concern.  We feel mutually agreed further evaluation of the symptoms takes top priority in the near future.  Recommendations: I have offered the patient a EGD with esophageal dilation as feasible/appropriate per plan-we will utilize propofol.The risks, benefits, limitations, alternatives and imponderables have been reviewed with the patient. Potential for esophageal dilation, biopsy, etc. have also been reviewed.  Questions have been answered. All parties agreeable.  She is urged to follow-up with her PCP as scheduled end of this week to further evaluate her, ataxia, weakness, falling  We will schedule EGD in about 3 to 4 weeks.   Notice: This dictation was prepared with Dragon dictation along with smaller phrase technology. Any transcriptional errors that result from this process are unintentional and may not be corrected upon review.

## 2019-05-01 NOTE — Patient Instructions (Addendum)
Schedule an EGD with esophageal dilation - propofol - esophageal dysphagia in 3- 4 weeks  See primary care Doctor for dizziness as schedule at the end of the week  Continue protonix 40 mg daily  Further recommendations  Hold evening dose of glipizide and glucophage night before and morning of procedure

## 2019-05-04 DIAGNOSIS — M7500 Adhesive capsulitis of unspecified shoulder: Secondary | ICD-10-CM | POA: Insufficient documentation

## 2019-05-08 ENCOUNTER — Telehealth: Payer: Self-pay | Admitting: *Deleted

## 2019-05-08 NOTE — Telephone Encounter (Signed)
Pre-op/COVID-19 testing scheduled for 9/11 at 9:00am, 10:00am.  Called patient and she is aware of appt details. She voiced understanding.

## 2019-05-08 NOTE — Telephone Encounter (Signed)
Patient on cancellation list for sooner procedure date. Called patient. She is now scheduled for 9/14 at 9:30am. Discussed new EGD instructions with patient in detail. She voiced understanding. Called endo and LMOVM making aware of appt change. Will call patient back with new pre-op/covid-19 testing appt.

## 2019-05-11 ENCOUNTER — Encounter (HOSPITAL_COMMUNITY)
Admission: RE | Admit: 2019-05-11 | Discharge: 2019-05-11 | Disposition: A | Discharge status: Home / Self Care | Payer: Medicare Other | Source: Ambulatory Visit | Attending: Internal Medicine | Admitting: Internal Medicine

## 2019-05-11 ENCOUNTER — Ambulatory Visit (HOSPITAL_COMMUNITY): Payer: Medicare Other | Admitting: Anesthesiology

## 2019-05-11 ENCOUNTER — Other Ambulatory Visit (HOSPITAL_COMMUNITY)
Admission: RE | Admit: 2019-05-11 | Discharge: 2019-05-11 | Disposition: A | Discharge status: Home / Self Care | Payer: Medicare Other | Source: Ambulatory Visit | Attending: Internal Medicine | Admitting: Internal Medicine

## 2019-05-11 ENCOUNTER — Other Ambulatory Visit: Payer: Self-pay

## 2019-05-11 DIAGNOSIS — Z20828 Contact with and (suspected) exposure to other viral communicable diseases: Secondary | ICD-10-CM | POA: Diagnosis not present

## 2019-05-11 DIAGNOSIS — Z01812 Encounter for preprocedural laboratory examination: Secondary | ICD-10-CM | POA: Diagnosis present

## 2019-05-11 DIAGNOSIS — R1310 Dysphagia, unspecified: Secondary | ICD-10-CM | POA: Diagnosis not present

## 2019-05-11 LAB — SARS CORONAVIRUS 2 (TAT 6-24 HRS): SARS Coronavirus 2: NEGATIVE

## 2019-05-14 ENCOUNTER — Ambulatory Visit (HOSPITAL_COMMUNITY)
Admission: RE | Admit: 2019-05-14 | Discharge: 2019-05-14 | Disposition: A | Discharge status: Home / Self Care | Payer: Medicare Other | Attending: Internal Medicine | Admitting: Internal Medicine

## 2019-05-14 ENCOUNTER — Encounter (HOSPITAL_COMMUNITY)
Admission: RE | Disposition: A | Discharge status: Home / Self Care | Payer: Self-pay | Source: Home / Self Care | Attending: Internal Medicine

## 2019-05-14 ENCOUNTER — Encounter (HOSPITAL_COMMUNITY): Payer: Self-pay

## 2019-05-14 ENCOUNTER — Other Ambulatory Visit: Payer: Self-pay

## 2019-05-14 DIAGNOSIS — R1310 Dysphagia, unspecified: Secondary | ICD-10-CM | POA: Insufficient documentation

## 2019-05-14 DIAGNOSIS — Z7984 Long term (current) use of oral hypoglycemic drugs: Secondary | ICD-10-CM | POA: Diagnosis not present

## 2019-05-14 DIAGNOSIS — M4690 Unspecified inflammatory spondylopathy, site unspecified: Secondary | ICD-10-CM | POA: Diagnosis not present

## 2019-05-14 DIAGNOSIS — Z888 Allergy status to other drugs, medicaments and biological substances status: Secondary | ICD-10-CM | POA: Insufficient documentation

## 2019-05-14 DIAGNOSIS — I10 Essential (primary) hypertension: Secondary | ICD-10-CM | POA: Insufficient documentation

## 2019-05-14 DIAGNOSIS — E119 Type 2 diabetes mellitus without complications: Secondary | ICD-10-CM | POA: Diagnosis not present

## 2019-05-14 DIAGNOSIS — F17210 Nicotine dependence, cigarettes, uncomplicated: Secondary | ICD-10-CM | POA: Diagnosis not present

## 2019-05-14 DIAGNOSIS — K222 Esophageal obstruction: Secondary | ICD-10-CM | POA: Insufficient documentation

## 2019-05-14 DIAGNOSIS — K219 Gastro-esophageal reflux disease without esophagitis: Secondary | ICD-10-CM | POA: Diagnosis not present

## 2019-05-14 DIAGNOSIS — F419 Anxiety disorder, unspecified: Secondary | ICD-10-CM | POA: Diagnosis not present

## 2019-05-14 DIAGNOSIS — Z79899 Other long term (current) drug therapy: Secondary | ICD-10-CM | POA: Insufficient documentation

## 2019-05-14 DIAGNOSIS — Z85038 Personal history of other malignant neoplasm of large intestine: Secondary | ICD-10-CM | POA: Insufficient documentation

## 2019-05-14 DIAGNOSIS — Z5309 Procedure and treatment not carried out because of other contraindication: Secondary | ICD-10-CM | POA: Insufficient documentation

## 2019-05-14 DIAGNOSIS — Z882 Allergy status to sulfonamides status: Secondary | ICD-10-CM | POA: Insufficient documentation

## 2019-05-14 DIAGNOSIS — R29818 Other symptoms and signs involving the nervous system: Secondary | ICD-10-CM | POA: Diagnosis not present

## 2019-05-14 DIAGNOSIS — Z7982 Long term (current) use of aspirin: Secondary | ICD-10-CM | POA: Diagnosis not present

## 2019-05-14 HISTORY — DX: Personal history of urinary calculi: Z87.442

## 2019-05-14 SURGERY — CANCELLED PROCEDURE

## 2019-05-14 NOTE — Interval H&P Note (Signed)
History and Physical Interval Note:  05/14/2019 8:26 AM  Candice Mccann  has presented today for surgery, with the diagnosis of dysphagia.  The various methods of treatment have been discussed with the patient and family. After consideration of risks, benefits and other options for treatment, the patient has consented to  Procedure(s) with comments: ESOPHAGOGASTRODUODENOSCOPY (EGD) WITH PROPOFOL (N/A) - 7:30am [place] DILATION (N/A) as a surgical intervention.  The patient's history has been reviewed, patient examined, no change in status, stable for surgery.  I have reviewed the patient's chart and labs.  Questions were answered to the patient's satisfaction.     [name]  Patient remains ataxic and dizzy.  Describing visual symptoms.  Has not been able to see the neurologist yet but appointment is upcoming.  Since seen in the office, she describes being admitted to [facility] rocking him for exacerbation of symptoms.  Describes CT being negative.  They were concerned about TIA/stroke.  I told the patient we needed to cancel this procedure until her neurological symptoms have been worked up.  Reasoning behind this reviewed.  I have canceled the procedure for today.  I discussed with anesthesia who agrees.

## 2019-05-15 NOTE — Progress Notes (Signed)
Procedure for 05/14/19 cancelled per MD and Anesthesiologist due to pt being off balance at baseline and had an neuro appt same day at 3pm and anesthesia would make her altered.    Satya Buttram Hermina Barters, RN   8:13 AM

## 2019-05-18 ENCOUNTER — Other Ambulatory Visit (HOSPITAL_COMMUNITY): Payer: Self-pay | Admitting: Neurology

## 2019-05-18 ENCOUNTER — Other Ambulatory Visit: Payer: Self-pay | Admitting: Neurology

## 2019-05-18 DIAGNOSIS — I63233 Cerebral infarction due to unspecified occlusion or stenosis of bilateral carotid arteries: Secondary | ICD-10-CM

## 2019-05-24 ENCOUNTER — Other Ambulatory Visit: Payer: Self-pay

## 2019-05-24 ENCOUNTER — Ambulatory Visit (HOSPITAL_COMMUNITY)
Admission: RE | Admit: 2019-05-24 | Discharge: 2019-05-24 | Disposition: A | Discharge status: Home / Self Care | Payer: Medicare Other | Source: Ambulatory Visit | Attending: Neurology | Admitting: Neurology

## 2019-05-24 DIAGNOSIS — I63233 Cerebral infarction due to unspecified occlusion or stenosis of bilateral carotid arteries: Secondary | ICD-10-CM | POA: Insufficient documentation

## 2019-07-05 ENCOUNTER — Other Ambulatory Visit (HOSPITAL_COMMUNITY): Payer: Medicare Other

## 2019-09-13 DIAGNOSIS — I639 Cerebral infarction, unspecified: Secondary | ICD-10-CM | POA: Insufficient documentation

## 2019-09-28 ENCOUNTER — Other Ambulatory Visit: Payer: Self-pay | Admitting: Family Medicine

## 2019-09-28 ENCOUNTER — Other Ambulatory Visit: Payer: Self-pay

## 2019-09-28 ENCOUNTER — Ambulatory Visit (HOSPITAL_COMMUNITY)
Admission: RE | Admit: 2019-09-28 | Discharge: 2019-09-28 | Disposition: A | Discharge status: Home / Self Care | Payer: Medicare Other | Source: Ambulatory Visit | Attending: Family Medicine | Admitting: Family Medicine

## 2019-09-28 ENCOUNTER — Other Ambulatory Visit (HOSPITAL_COMMUNITY): Payer: Self-pay | Admitting: Family Medicine

## 2019-09-28 DIAGNOSIS — R296 Repeated falls: Secondary | ICD-10-CM

## 2019-09-28 DIAGNOSIS — I998 Other disorder of circulatory system: Secondary | ICD-10-CM

## 2019-09-28 DIAGNOSIS — I639 Cerebral infarction, unspecified: Secondary | ICD-10-CM | POA: Diagnosis present

## 2019-10-11 ENCOUNTER — Encounter (HOSPITAL_COMMUNITY): Payer: Self-pay | Admitting: Family Medicine

## 2019-10-17 ENCOUNTER — Ambulatory Visit (INDEPENDENT_AMBULATORY_CARE_PROVIDER_SITE_OTHER): Payer: Medicare Other | Admitting: Diagnostic Neuroimaging

## 2019-10-17 ENCOUNTER — Encounter: Payer: Self-pay | Admitting: Diagnostic Neuroimaging

## 2019-10-17 ENCOUNTER — Other Ambulatory Visit: Payer: Self-pay

## 2019-10-17 VITALS — BP 134/86 | HR 90 | Ht 62.0 in | Wt 149.4 lb

## 2019-10-17 DIAGNOSIS — I639 Cerebral infarction, unspecified: Secondary | ICD-10-CM

## 2019-10-17 DIAGNOSIS — I6350 Cerebral infarction due to unspecified occlusion or stenosis of unspecified cerebral artery: Secondary | ICD-10-CM | POA: Diagnosis not present

## 2019-10-17 MED ORDER — CLOPIDOGREL BISULFATE 75 MG PO TABS: 75.0000 mg | ORAL_TABLET | Freq: Every day | ORAL | 6 refills | Status: DC

## 2019-10-17 NOTE — Progress Notes (Signed)
[facility]  PATIENT: Candice Mccann DOB: 10-Oct-1955  REFERRING CLINICIAN: Sandi Mealy, MD HISTORY FROM: patient  REASON FOR VISIT: new consult   HISTORICAL  CHIEF COMPLAINT:  Chief Complaint  Patient presents with  . New Patient (Initial Visit)    Rm 6 with husband. She reports since her most recent stroke she has had trouble with bilateral leg weakness. Reports 1 fall over the last month. Pt reports she was started on a blood thinner but is unsure of the name.     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  09/13/2019 patient had slurred speech, left-sided weakness, gait difficulty, went to the hospital for evaluation was diagnosed with acute left pontine ischemic infarction.  Patient was admitted for stroke work-up.  Patient had been on aspirin 325 mg daily before the stroke.  She is continued on aspirin 162 mg daily after the stroke.  Patient was noted to have unequal blood pressures, left greater than right side.  CTA of the head and neck was ordered but could not be obtained due to history of severe allergic reaction to CT dye in the past.  Follow-up carotid ultrasound shows anterograde flow in the vertebral arteries and no significant carotid stenosis.  Since that time patient continues to have gait and balance difficulty.  She is using a cane and walker at home.  Speech is still affected.  Left side is still weak.  She has had some home physical therapy.   REVIEW OF SYSTEMS: Full 14 system review of systems performed and negative with exception of: As per HPI.  ALLERGIES: Allergies  Allergen Reactions  . Contrast Media [Iodinated Diagnostic Agents] Anaphylaxis and Nausea And Vomiting  . Nitroglycerin Other (See Comments)    Cardiac arrest  . Tomato Itching  . Sulfa Antibiotics Rash    HOME MEDICATIONS: Outpatient Medications Prior to  Visit  Medication Sig Dispense Refill  . ALPRAZolam (XANAX) 1 MG tablet Take 1 mg by mouth at bedtime.     . ASPIRIN 81 PO Take by mouth.    . cloNIDine (CATAPRES) 0.1 MG tablet Take 0.1 mg by mouth 2 (two) times daily as needed (headaches).     . diphenhydramine-acetaminophen (TYLENOL PM) 25-500 MG TABS tablet Take 2 tablets by mouth at bedtime as needed. (Patient taking differently: Take 2 tablets by mouth at bedtime. ) 10 tablet 0  . glipiZIDE (GLUCOTROL) 10 MG tablet Take 10 mg by mouth 2 (two) times daily before a meal.    . HYDROcodone-acetaminophen (NORCO) 10-325 MG tablet Take 1 tablet by mouth every 6 (six) hours as needed for severe pain.     Marland Kitchen lisinopril (ZESTRIL) 40 MG tablet Take 40 mg by mouth daily.    . metFORMIN (GLUCOPHAGE) 1000 MG tablet Take 1,000 mg by mouth 2 (two) times daily with a meal.     . metoprolol tartrate (LOPRESSOR) 25 MG tablet Take 1 tablet (25 mg total) by mouth 2 (two) times daily. (Patient taking differently: Take 25 mg by mouth every morning. ) 60 tablet 0  . pantoprazole (PROTONIX) 40 MG tablet Take 1 tablet (40 mg total) by mouth daily before breakfast. 30 tablet 5  . polyethylene glycol (MIRALAX / GLYCOLAX) 17 g packet Take 17 g by mouth daily as needed for moderate constipation.     . potassium chloride SA (K-DUR,KLOR-CON) 20 MEQ tablet Take 20 mEq by mouth daily.    Marland Kitchen  simvastatin (ZOCOR) 40 MG tablet Take 40 mg by mouth every evening.     Marland Kitchen tiZANidine (ZANAFLEX) 4 MG capsule Take 1 capsule (4 mg total) by mouth at bedtime as needed for muscle spasms. (Patient taking differently: Take 12 mg by mouth at bedtime. ) 10 capsule 0  . vitamin B-12 (CYANOCOBALAMIN) 1000 MCG tablet Take 1,000 mcg by mouth daily.     No facility-administered medications prior to visit.    PAST MEDICAL HISTORY: Past Medical History:  Diagnosis Date  . Anxiety   . Arthritis    back  . Colon cancer ([place])   . Diabetes ([place])   . Headache   . History of kidney stones   .  Hypertension     PAST SURGICAL HISTORY: Past Surgical History:  Procedure Laterality Date  . ABDOMINAL HYSTERECTOMY    . BACK SURGERY    . CHOLECYSTECTOMY    . COLON SURGERY  2009   colon cancer  . COLONOSCOPY  09/2017   Dr. Burke Keels: Normal colonoscopy  . ESOPHAGOGASTRODUODENOSCOPY (EGD) WITH PROPOFOL N/A 06/15/2018   Dr. Gala Romney: schatzki ring s/p dilation. mild reflux esophagitis.   . EUS  2016   Dr. Christoper Fabian   . [place] N/A 06/15/2018   Procedure: Venia Minks DILATION;  Surgeon: Daneil Dolin, MD;  Location: [facility];  Service: Endoscopy;  Laterality: N/A;    FAMILY HISTORY: History reviewed. No pertinent family history.  SOCIAL HISTORY: Social History   Socioeconomic History  . Marital status: Married    Spouse name: Not on file  . Number of children: Not on file  . Years of education: Not on file  . Highest education level: Not on file  Occupational History  . Not on file  Tobacco Use  . Smoking status: Current Every Day Smoker    Types: Cigarettes  . Smokeless tobacco: Never Used  Substance and Sexual Activity  . Alcohol use: Not Currently  . Drug use: Never  . Sexual activity: Not on file  Other Topics Concern  . Not on file  Social History Narrative  . Not on file   Social Determinants of Health   Financial Resource Strain:   . Difficulty of Paying Living Expenses: Not on file  Food Insecurity:   . Worried About Charity fundraiser in the Last Year: Not on file  . Ran Out of Food in the Last Year: Not on file  Transportation Needs:   . Lack of Transportation (Medical): Not on file  . Lack of Transportation (Non-Medical): Not on file  Physical Activity:   . Days of Exercise per Week: Not on file  . Minutes of Exercise per Session: Not on file  Stress:   . Feeling of Stress : Not on file  Social Connections:   . Frequency of Communication with Friends and Family: Not on file  . Frequency of Social Gatherings with  Friends and Family: Not on file  . Attends Religious Services: Not on file  . Active Member of Clubs or Organizations: Not on file  . Attends Archivist Meetings: Not on file  . Marital Status: Not on file  Intimate Partner Violence:   . Fear of Current or Ex-Partner: Not on file  . Emotionally Abused: Not on file  . Physically Abused: Not on file  . Sexually Abused: Not on file     PHYSICAL EXAM  GENERAL EXAM/CONSTITUTIONAL: Vitals:  Vitals:   10/17/19 1040  BP: 134/86  Pulse: 90  SpO2: 99%  Weight: 149 lb 6 oz (67.8 kg)  Height: 5\' 2"  (1.575 m)    Body mass index is 27.32 kg/m. Wt Readings from Last 3 Encounters:  10/17/19 149 lb 6 oz (67.8 kg)  05/01/19 154 lb (69.9 kg)  06/15/18 162 lb (73.5 kg)    No data found.    Patient is in no distress; well developed, nourished and groomed; neck is supple  CARDIOVASCULAR:  Examination of carotid arteries is normal; no carotid bruits  Regular rate and rhythm, no murmurs  Examination of peripheral vascular system by observation and palpation is normal  EYES:  Ophthalmoscopic exam of optic discs and posterior segments is normal; no papilledema or hemorrhages  No exam data present  MUSCULOSKELETAL:  Gait, strength, tone, movements noted in Neurologic exam below  NEUROLOGIC: MENTAL STATUS:  No flowsheet data found.  awake, alert, oriented to person, place and time  recent and remote memory intact  normal attention and concentration  language fluent, comprehension intact, naming intact  fund of knowledge appropriate  CRANIAL NERVE:   2nd - no papilledema on fundoscopic exam  2nd, 3rd, 4th, 6th - pupils equal and reactive to light, visual fields full to confrontation, extraocular muscles intact, no nystagmus  5th - facial sensation symmetric  7th - facial strength symmetric  8th - hearing intact  9th - palate elevates symmetrically, uvula midline  11th - shoulder shrug  symmetric  12th - tongue protrusion midline  SLURRED SPEECH  MOTOR:   normal bulk and tone  RUE 5; LUE 4  RLE 4 PROX, 5 DISTAL  LLE 4- PROX, 5 DISTAL  SENSORY:   normal and symmetric to light touch, temperature, vibration  COORDINATION:   finger-nose-finger, fine finger movements SLOW ON LEFT  REFLEXES:   deep tendon reflexes TRACE and symmetric  GAIT/STATION:   IN WHEEL CHAIR; UNSTEADY GAIT WITH SINGLE POINT CANE AND ASSISTANCE     DIAGNOSTIC DATA (LABS, IMAGING, TESTING) - I reviewed patient records, labs, notes, testing and imaging myself where available.  Lab Results  Component Value Date   WBC 7.5 03/13/2019   HGB 13.0 03/13/2019   HCT 39.5 03/13/2019   MCV 89.0 03/13/2019   PLT 290 03/13/2019      Component Value Date/Time   NA 142 03/13/2019 1509   K 4.8 03/13/2019 1509   CL 103 03/13/2019 1509   CO2 30 03/13/2019 1509   GLUCOSE 276 (H) 03/13/2019 1509   BUN 15 03/13/2019 1509   CREATININE 0.82 03/13/2019 1509   CALCIUM 9.9 03/13/2019 1509   PROT 6.9 03/13/2019 1509   ALBUMIN 3.5 02/08/2018 0518   AST 12 03/13/2019 1509   ALT 7 03/13/2019 1509   ALKPHOS 72 02/08/2018 0518   BILITOT 0.5 03/13/2019 1509   GFRNONAA >60 02/08/2018 0518   GFRAA >60 02/08/2018 0518   No results found for: CHOL, HDL, LDLCALC, LDLDIRECT, TRIG, CHOLHDL Lab Results  Component Value Date   HGBA1C 9.5 (H) 02/05/2018   No results found for: VITAMINB12 No results found for: TSH   09/28/19 carotid u/s - Color duplex indicates minimal heterogeneous and calcified plaque, with no hemodynamically significant stenosis by duplex criteria in the extracranial cerebrovascular circulation.  - anterograde flow in bilateral vertebral arteries   ASSESSMENT AND PLAN  64 y.o. 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  Dx:  1. Left pontine stroke ([place])  PLAN:  LEFT PONTINE STROKE - stop  aspirin; start plavix 75mg  daily - continue BP, DM, lipid control per PCP - continue home PT  REPORTED UNEQUAL BP IN ARMS (radial pulses symm today; less than 47mmHg difference in right - left SBP today) - follow up with PCP; may consider cardiology or vascular surgery consult for possible subclavian stenosis evaluation in future (cannot have CTA dye due to history of severe contrast allergy)   Meds ordered this encounter  Medications  . clopidogrel (PLAVIX) 75 MG tablet    Sig: Take 1 tablet (75 mg total) by mouth daily.    Dispense:  30 tablet    Refill:  6    Return for return to PCP, pending if symptoms worsen or fail to improve.    Penni Bombard, MD [id number], [id number] AM Certified in Neurology, Neurophysiology and Neuroimaging  [facility] [address], [place], [place] [address] [phone]

## 2019-10-17 NOTE — Patient Instructions (Signed)
LEFT PONTINE STROKE - stop aspirin; start plavix 75mg  daily  - continue BP, DM, lipid control per PCP  - continue home PT   UNEQUAL BP IN ARMS - follow up with PCP; consider vascular surgery consult for possible subclavian stenosis and further evaluation (cannot have CT dye due to severe contrast allergy)

## 2019-10-17 NOTE — Progress Notes (Signed)
BP left upper arm, 132/84 HR 85 BP right upper arm, 124/81, HR 87

## 2019-10-24 ENCOUNTER — Other Ambulatory Visit: Payer: Self-pay | Admitting: Family Medicine

## 2019-10-24 ENCOUNTER — Other Ambulatory Visit: Payer: Self-pay | Admitting: Family

## 2019-10-24 DIAGNOSIS — I771 Stricture of artery: Secondary | ICD-10-CM

## 2019-11-10 ENCOUNTER — Other Ambulatory Visit: Payer: Self-pay | Admitting: Gastroenterology

## 2019-11-28 ENCOUNTER — Other Ambulatory Visit: Payer: Medicare Other

## 2019-12-26 ENCOUNTER — Other Ambulatory Visit: Payer: Self-pay | Admitting: Family

## 2019-12-26 ENCOUNTER — Ambulatory Visit (INDEPENDENT_AMBULATORY_CARE_PROVIDER_SITE_OTHER): Payer: Medicare Other

## 2019-12-26 ENCOUNTER — Other Ambulatory Visit: Payer: Self-pay

## 2019-12-26 DIAGNOSIS — I771 Stricture of artery: Secondary | ICD-10-CM

## 2019-12-26 DIAGNOSIS — R031 Nonspecific low blood-pressure reading: Secondary | ICD-10-CM | POA: Diagnosis not present

## 2020-01-15 ENCOUNTER — Other Ambulatory Visit: Payer: Self-pay | Admitting: *Deleted

## 2020-01-15 DIAGNOSIS — R0989 Other specified symptoms and signs involving the circulatory and respiratory systems: Secondary | ICD-10-CM

## 2020-01-21 ENCOUNTER — Telehealth (HOSPITAL_COMMUNITY): Payer: Self-pay

## 2020-01-21 NOTE — Telephone Encounter (Signed)

## 2020-01-22 ENCOUNTER — Other Ambulatory Visit: Payer: Self-pay

## 2020-01-22 ENCOUNTER — Ambulatory Visit (INDEPENDENT_AMBULATORY_CARE_PROVIDER_SITE_OTHER): Payer: Medicare Other | Admitting: Vascular Surgery

## 2020-01-22 ENCOUNTER — Ambulatory Visit (HOSPITAL_COMMUNITY)
Admission: RE | Admit: 2020-01-22 | Discharge: 2020-01-22 | Disposition: A | Discharge status: Home / Self Care | Payer: Medicare Other | Source: Ambulatory Visit | Attending: Family Medicine | Admitting: Family Medicine

## 2020-01-22 DIAGNOSIS — R0989 Other specified symptoms and signs involving the circulatory and respiratory systems: Secondary | ICD-10-CM | POA: Diagnosis present

## 2020-01-22 DIAGNOSIS — R42 Dizziness and giddiness: Secondary | ICD-10-CM

## 2020-01-22 NOTE — Progress Notes (Signed)
Patient name: Candice Mccann MRN: [id number] DOB: 1955-10-17 Sex: female  REASON FOR CONSULT: Evaluate possible subclavian artery stenosis  HPI: Candice Mccann is a 64 y.o. female, with history of hypertension, diabetes, CVA that presents for evaluation of subclavian artery stenosis per the notes from [place].  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  Per the notes there was concern about asymmetric blood pressure that was lower on the right and concerned about right subclavian stenosis.  This is in the setting of ongoing balance issues.  At the time of her initial stroke work-up was found to have no significant carotid disease and has been followed by neurology.  She was transitioned from aspirin to Plavix.  She is fairly sedentary at home.  She reports ongoing gait problems when walking that all started since her stroke in January.   Past Medical History:  Diagnosis Date  . Anxiety   . Arthritis    back  . Colon cancer ([place])   . Diabetes ([place])   . Headache   . History of kidney stones   . Hypertension     Past Surgical History:  Procedure Laterality Date  . ABDOMINAL HYSTERECTOMY    . BACK SURGERY    . CHOLECYSTECTOMY    . COLON SURGERY  2009   colon cancer  . COLONOSCOPY  09/2017   Dr. Burke Keels: Normal colonoscopy  . ESOPHAGOGASTRODUODENOSCOPY (EGD) WITH PROPOFOL N/A 06/15/2018   Dr. Gala Romney: schatzki ring s/p dilation. mild reflux esophagitis.   . EUS  2016   Dr. Christoper Fabian   . [place] N/A 06/15/2018   Procedure: Venia Minks DILATION;  Surgeon: Daneil Dolin, MD;  Location: [facility];  Service: Endoscopy;  Laterality: N/A;    No family history on file.  SOCIAL HISTORY: Social History   Socioeconomic History  . Marital status: Married    Spouse name: Not on file  . Number of children: Not on  file  . Years of education: Not on file  . Highest education level: Not on file  Occupational History  . Not on file  Tobacco Use  . Smoking status: Current Every Day Smoker    Types: Cigarettes  . Smokeless tobacco: Never Used  Substance and Sexual Activity  . Alcohol use: Not Currently  . Drug use: Never  . Sexual activity: Not on file  Other Topics Concern  . Not on file  Social History Narrative  . Not on file   Social Determinants of Health   Financial Resource Strain:   . Difficulty of Paying Living Expenses:   Food Insecurity:   . Worried About Charity fundraiser in the Last Year:   . Arboriculturist in the Last Year:   Transportation Needs:   . Film/video editor (Medical):   Marland Kitchen Lack of Transportation (Non-Medical):   Physical Activity:   . Days of Exercise per Week:   . Minutes of Exercise per Session:   Stress:   . Feeling of Stress :   Social Connections:   . Frequency of Communication with Friends and Family:   . Frequency of Social Gatherings with Friends and Family:   . Attends Religious Services:   . Active Member of Clubs or Organizations:   . Attends Archivist Meetings:   .  Marital Status:   Intimate Partner Violence:   . Fear of Current or Ex-Partner:   . Emotionally Abused:   Marland Kitchen Physically Abused:   . Sexually Abused:     Allergies  Allergen Reactions  . Contrast Media [Iodinated Diagnostic Agents] Anaphylaxis and Nausea And Vomiting  . Nitroglycerin Other (See Comments)    Cardiac arrest  . Tomato Itching  . Sulfa Antibiotics Rash    Current Outpatient Medications  Medication Sig Dispense Refill  . ALPRAZolam (XANAX) 1 MG tablet Take 1 mg by mouth at bedtime.     . ASPIRIN 81 PO Take by mouth.    . cloNIDine (CATAPRES) 0.1 MG tablet Take 0.1 mg by mouth 2 (two) times daily as needed (headaches).     . clopidogrel (PLAVIX) 75 MG tablet Take 1 tablet (75 mg total) by mouth daily. 30 tablet 6  . diphenhydramine-acetaminophen  (TYLENOL PM) 25-500 MG TABS tablet Take 2 tablets by mouth at bedtime as needed. (Patient taking differently: Take 2 tablets by mouth at bedtime. ) 10 tablet 0  . glipiZIDE (GLUCOTROL) 10 MG tablet Take 10 mg by mouth 2 (two) times daily before a meal.    . HYDROcodone-acetaminophen (NORCO) 10-325 MG tablet Take 1 tablet by mouth every 6 (six) hours as needed for severe pain.     Marland Kitchen lisinopril (ZESTRIL) 40 MG tablet Take 40 mg by mouth daily.    . metFORMIN (GLUCOPHAGE) 1000 MG tablet Take 1,000 mg by mouth 2 (two) times daily with a meal.     . metoprolol tartrate (LOPRESSOR) 25 MG tablet Take 1 tablet (25 mg total) by mouth 2 (two) times daily. (Patient taking differently: Take 25 mg by mouth every morning. ) 60 tablet 0  . pantoprazole (PROTONIX) 40 MG tablet TAKE 1 TABLET(40 MG) BY MOUTH DAILY BEFORE BREAKFAST 30 tablet 11  . polyethylene glycol (MIRALAX / GLYCOLAX) 17 g packet Take 17 g by mouth daily as needed for moderate constipation.     . potassium chloride SA (K-DUR,KLOR-CON) 20 MEQ tablet Take 20 mEq by mouth daily.    . simvastatin (ZOCOR) 40 MG tablet Take 40 mg by mouth every evening.     Marland Kitchen tiZANidine (ZANAFLEX) 4 MG capsule Take 1 capsule (4 mg total) by mouth at bedtime as needed for muscle spasms. (Patient taking differently: Take 12 mg by mouth at bedtime. ) 10 capsule 0  . vitamin B-12 (CYANOCOBALAMIN) 1000 MCG tablet Take 1,000 mcg by mouth daily.     No current facility-administered medications for this visit.    REVIEW OF SYSTEMS:  [X]  denotes positive finding, [ ]  denotes negative finding Cardiac  Comments:  Chest pain or chest pressure:    Shortness of breath upon exertion:    Short of breath when lying flat:    Irregular heart rhythm:        Vascular    Pain in calf, thigh, or hip brought on by ambulation:    Pain in feet at night that wakes you up from your sleep:     Blood clot in your veins:    Leg swelling:         Pulmonary    Oxygen at home:      Productive cough:     Wheezing:         Neurologic    Sudden weakness in arms or legs:     Sudden numbness in arms or legs:     Sudden onset of difficulty speaking or slurred  speech:    Temporary loss of vision in one eye:     Problems with dizziness:         Gastrointestinal    Blood in stool:     Vomited blood:         Genitourinary    Burning when urinating:     Blood in urine:        Psychiatric    Major depression:         Hematologic    Bleeding problems:    Problems with blood clotting too easily:        Skin    Rashes or ulcers:        Constitutional    Fever or chills:      PHYSICAL EXAM: Vitals:   01/22/20 1212 01/22/20 1217  BP: (!) 143/89 (!) 143/91  Pulse: 96 96  Resp: 14   Temp: 97.7 F (36.5 C)   TempSrc: Temporal   SpO2: 100%   Weight: 138 lb (62.6 kg)   Height: 5\' 2"  (1.575 m)     GENERAL: The patient is a well-nourished female, in no acute distress. The vital signs are documented above. CARDIAC: There is a regular rate and rhythm.  VASCULAR:  Right brachial and subclavian pulse palpable 2+ Left brachial and subclavian pulse palpable 2+ Bilateral DP pulses palpable PULMONARY: There is good air exchange bilaterally without wheezing or rales. ABDOMEN: Soft and non-tender with normal pitched bowel sounds.  MUSCULOSKELETAL: There are no major deformities or cyanosis. SKIN: There are no ulcers or rashes noted. PSYCHIATRIC: The patient has a normal affect.  DATA:   Carotid duplex today shows no significant carotid disease bilaterally.  She has antegrade flow in both vertebral arteries.  She has normal triphasic waveforms in the subclavian and brachial arteries bilaterally.  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  I discussed with the patient and her husband in detail that I do not think she has  any significant subclavian stenosis.  She has easily palpable subclavian and brachial artery pulses on exam in both upper extremities.  Her duplex today shows triphasic waveforms in both upper extremities including in the subclavian arteries.  In addition if the question was raised about subclavian steal that could be causing her gait disturbance, she has normal antegrade flow in both vertebral arteries and no signs of flow reversal in the vertebral artery that would explain her dizziness.  In addition she has no significant carotid disease that would warrant any intervention.  I explained all this to her and her husband in detail.  She can follow-up with [place] as needed.   Marty Heck, MD [facility] Office: [phone]

## 2020-11-20 ENCOUNTER — Other Ambulatory Visit: Payer: Self-pay | Admitting: Gastroenterology

## 2021-02-20 IMAGING — MR MRI ABDOMEN (MRCP)
6 of 10 series · 23 of 48 positions shown · non-contrast
Comparison: No prior abdominal MRI. CT the abdomen and pelvis
05/16/2018.

CLINICAL DATA: 62-year-old female with history of acute
pancreatitis.

EXAM:
MRI ABDOMEN WITHOUT CONTRAST  (INCLUDING MRCP)
TECHNIQUE: Multiplanar multisequence MR imaging of the abdomen was performed.
Heavily T2-weighted images of the biliary and pancreatic ducts were
obtained, and three-dimensional MRCP images were rendered by post
processing.

[Series 3: T2 · coronal · 5.0mm · 1.12mm/px · 2 of 36 slices shown (1 of 2)]
[im 1/36]
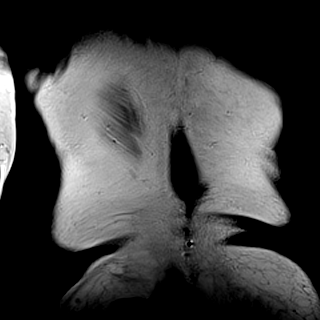
[im 36/36]
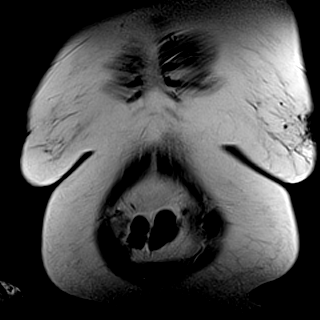

[Series 5: ax dual echo_in · axial · 4.0mm · 0.59mm/px · z∈[-157,+95]mm · 4 of 64 slices shown]
[im 1/64]
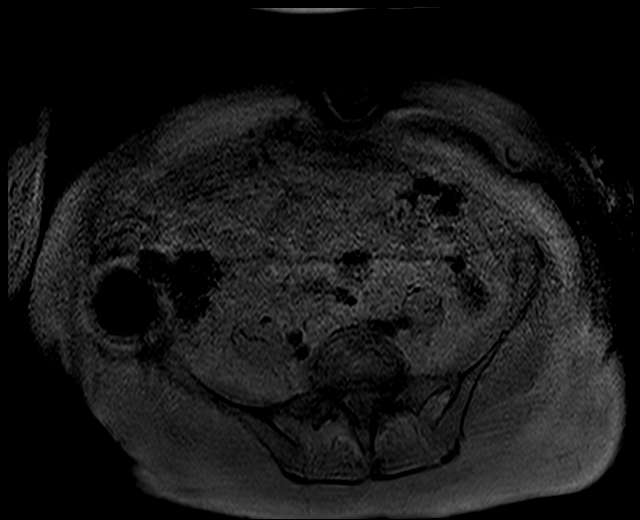
[im 22/64]
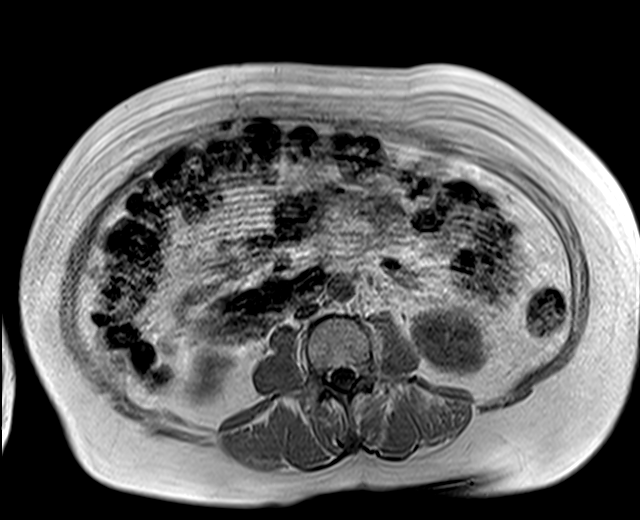
[im 43/64]
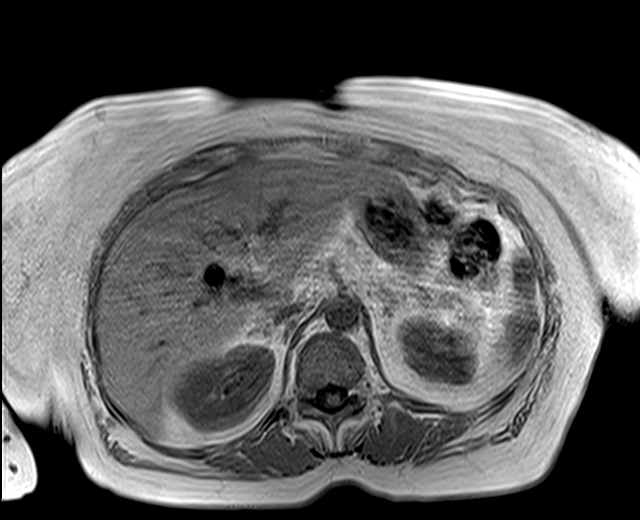
[im 64/64]
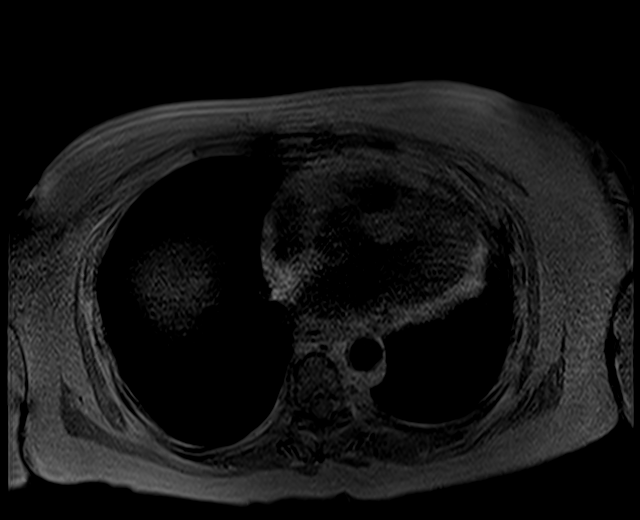

[Series 6: ax dual echo_opp · axial · 4.0mm · 0.59mm/px · z∈[-157,+95]mm · 4 of 64 slices shown]
[im 1/64]
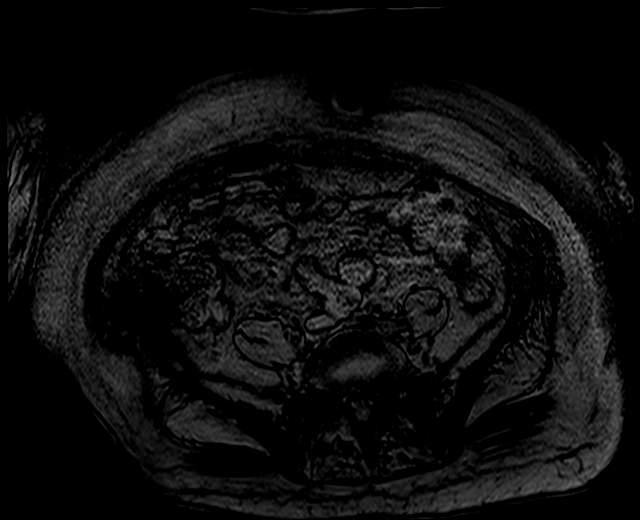
[im 22/64]
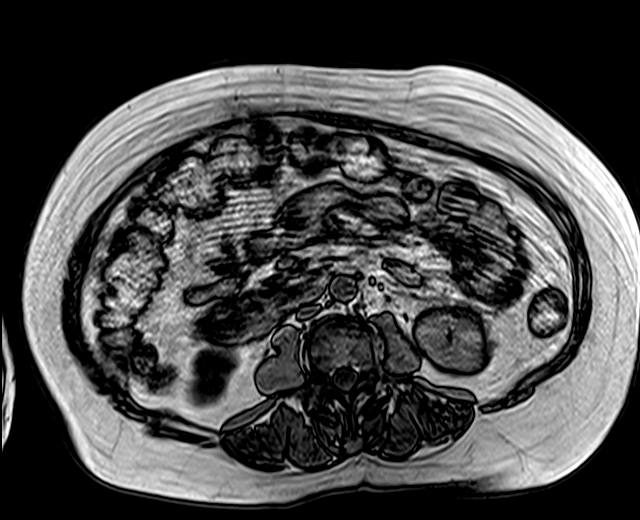
[im 43/64]
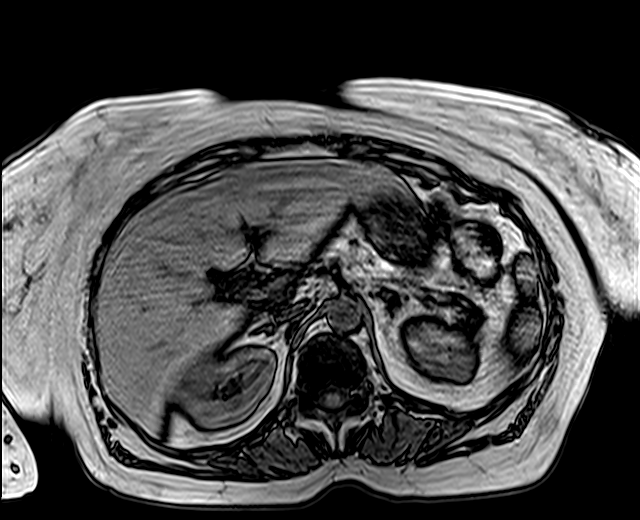
[im 64/64]
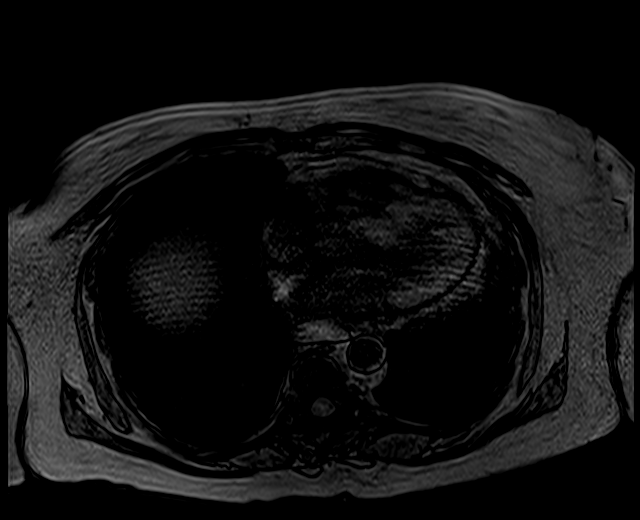

[Series 11: T2 · axial · 5.0mm · 1.10mm/px · z∈[-108,+126]mm · 3 of 40 slices shown (2 of 2)]
[im 1/40]
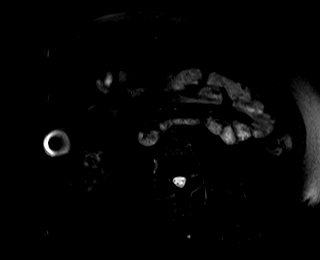
[im 20/40]
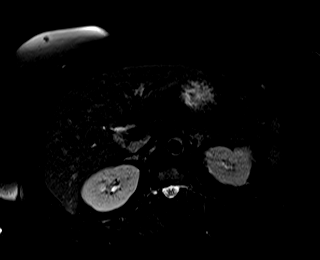
[im 40/40]
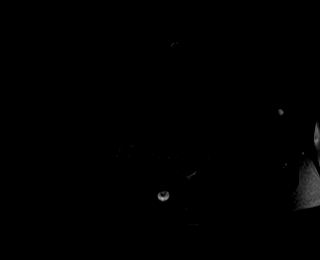

[Series 12: bSSFP · axial · 4.0mm · 0.64mm/px · z∈[-129,+147]mm · 5 of 70 slices shown]
[im 1/70]
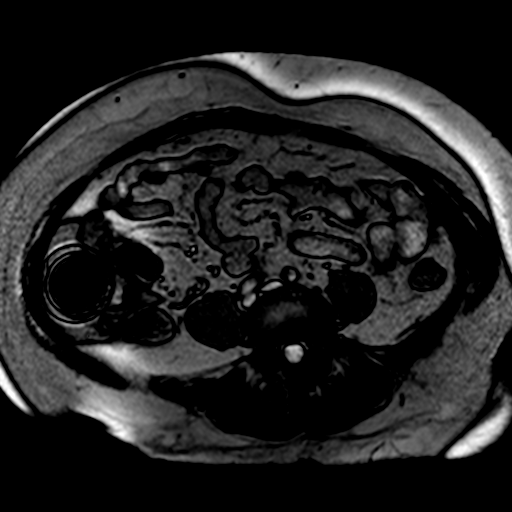
[im 18/70]
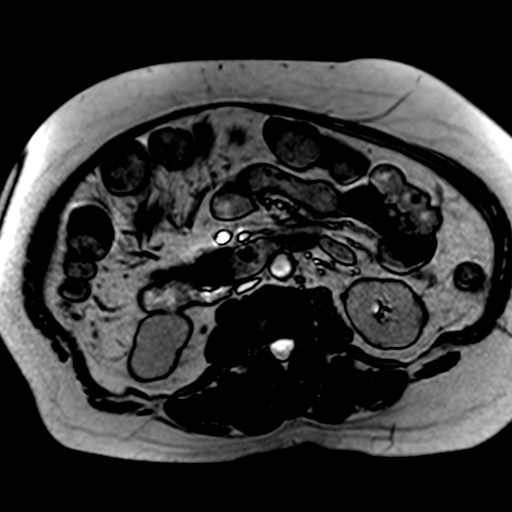
[im 35/70]
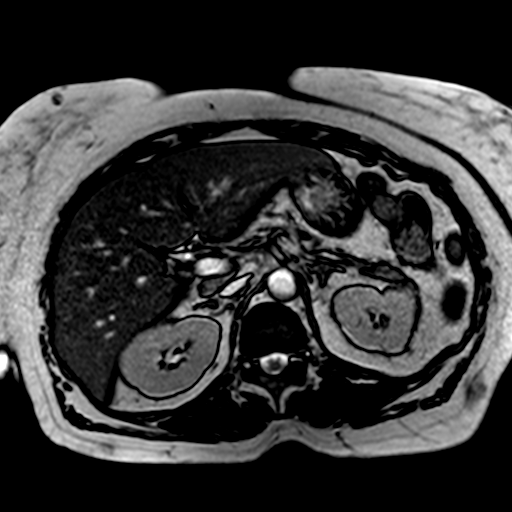
[im 52/70]
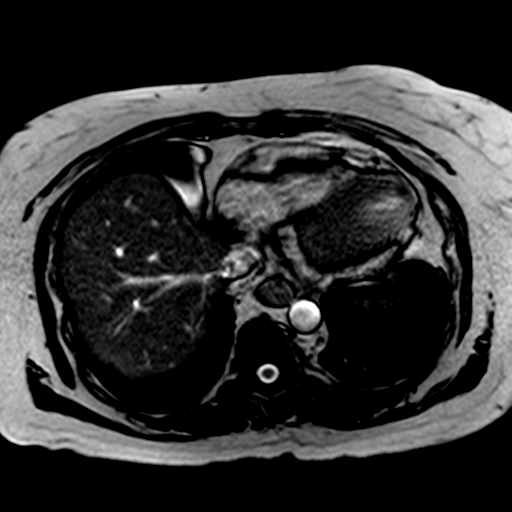
[im 70/70]
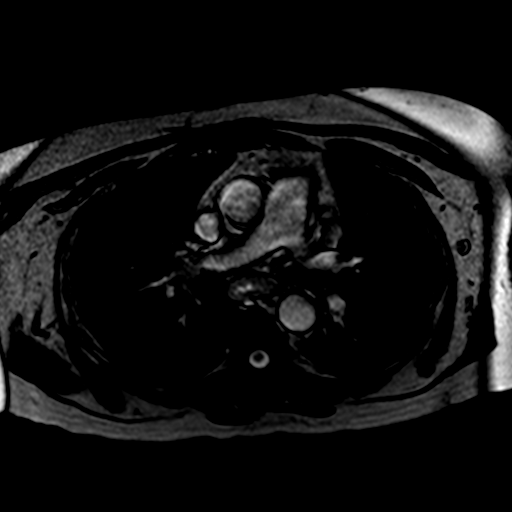

[Series 15: DWI · axial · 5.0mm · 0.99mm/px · z∈[-108,+78]mm · 5 of 80 slices shown]
[im 1/80]
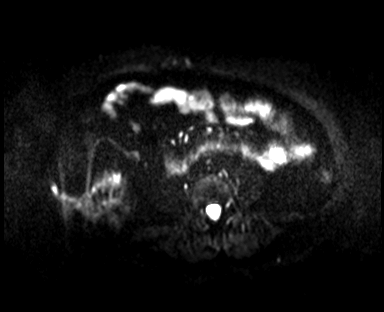
[im 16/80]
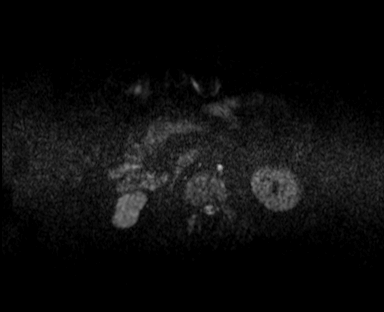
[im 32/80]
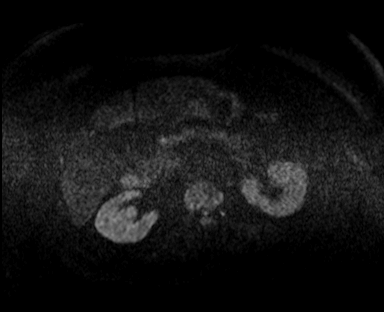
[im 48/80]
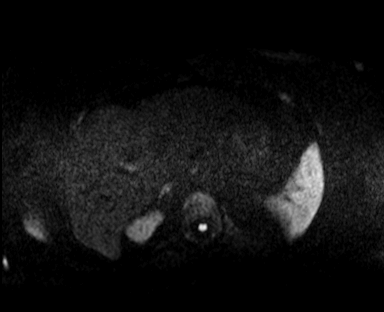
[im 64/80]
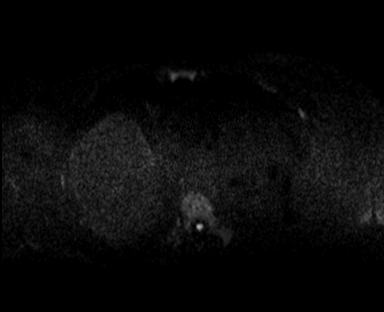

[23 of 48 positions shown; findings below may reference images not displayed]

FINDINGS: Comment: Study is limited for detection and characterization of
visceral and/or vascular lesions by lack of IV gadolinium.

Lower chest: Unremarkable.

Hepatobiliary: No definite suspicious cystic or solid hepatic
lesions are confidently identified on today's noncontrast MRI
examination. Status post cholecystectomy. MRCP images demonstrate no
intra or extrahepatic biliary ductal dilatation. Common bile duct
measures 6 mm in the porta hepatis. No filling defects in the common
bile duct to suggest choledocholithiasis.

Pancreas: No definite pancreatic mass or peripancreatic fluid
collections or inflammatory changes noted on today's noncontrast MRI
examination. No pancreatic ductal dilatation noted on on MRCP
images.

Spleen:  Unremarkable.

Adrenals/Urinary Tract: Bilateral kidneys and bilateral adrenal
glands are normal in appearance. No hydroureteronephrosis in the
visualized portions of the abdomen.

Stomach/Bowel: Visualized portions are unremarkable.

Vascular/Lymphatic: Aortic atherosclerosis, without definite
aneurysm in the abdominal vasculature. No lymphadenopathy noted in
the abdomen.

Other: No significant volume of ascites noted in the visualized
portions of the abdomen.

Musculoskeletal: No aggressive appearing osseous lesions are noted
in the visualized portions of the skeleton.
IMPRESSION: 1. No acute findings are noted in the abdomen. Specifically, no
findings to suggest an acute pancreatitis.
2. Status post cholecystectomy.

## 2021-05-06 DIAGNOSIS — F32A Depression, unspecified: Secondary | ICD-10-CM | POA: Insufficient documentation

## 2021-05-09 DIAGNOSIS — Z8673 Personal history of transient ischemic attack (TIA), and cerebral infarction without residual deficits: Secondary | ICD-10-CM

## 2022-02-04 DIAGNOSIS — N3001 Acute cystitis with hematuria: Secondary | ICD-10-CM | POA: Diagnosis present

## 2022-02-06 DIAGNOSIS — E876 Hypokalemia: Secondary | ICD-10-CM | POA: Insufficient documentation

## 2022-08-02 ENCOUNTER — Emergency Department (HOSPITAL_COMMUNITY): Payer: Medicare Other

## 2022-08-02 ENCOUNTER — Observation Stay (HOSPITAL_COMMUNITY)
Admission: EM | Admit: 2022-08-02 | Discharge: 2022-08-04 | Disposition: A | Discharge status: Home Health | Payer: Medicare Other | Attending: Family Medicine | Admitting: Family Medicine

## 2022-08-02 ENCOUNTER — Encounter (HOSPITAL_COMMUNITY): Payer: Self-pay | Admitting: *Deleted

## 2022-08-02 ENCOUNTER — Other Ambulatory Visit: Payer: Self-pay

## 2022-08-02 DIAGNOSIS — Z85038 Personal history of other malignant neoplasm of large intestine: Secondary | ICD-10-CM | POA: Diagnosis not present

## 2022-08-02 DIAGNOSIS — Z79899 Other long term (current) drug therapy: Secondary | ICD-10-CM | POA: Insufficient documentation

## 2022-08-02 DIAGNOSIS — Z7984 Long term (current) use of oral hypoglycemic drugs: Secondary | ICD-10-CM | POA: Insufficient documentation

## 2022-08-02 DIAGNOSIS — N3001 Acute cystitis with hematuria: Principal | ICD-10-CM | POA: Diagnosis present

## 2022-08-02 DIAGNOSIS — R300 Dysuria: Principal | ICD-10-CM

## 2022-08-02 DIAGNOSIS — Z636 Dependent relative needing care at home: Secondary | ICD-10-CM | POA: Insufficient documentation

## 2022-08-02 DIAGNOSIS — R531 Weakness: Secondary | ICD-10-CM | POA: Diagnosis not present

## 2022-08-02 DIAGNOSIS — Z8673 Personal history of transient ischemic attack (TIA), and cerebral infarction without residual deficits: Secondary | ICD-10-CM | POA: Diagnosis not present

## 2022-08-02 DIAGNOSIS — R262 Difficulty in walking, not elsewhere classified: Secondary | ICD-10-CM | POA: Diagnosis present

## 2022-08-02 DIAGNOSIS — E119 Type 2 diabetes mellitus without complications: Secondary | ICD-10-CM | POA: Insufficient documentation

## 2022-08-02 DIAGNOSIS — R270 Ataxia, unspecified: Secondary | ICD-10-CM | POA: Insufficient documentation

## 2022-08-02 DIAGNOSIS — E1169 Type 2 diabetes mellitus with other specified complication: Secondary | ICD-10-CM | POA: Diagnosis present

## 2022-08-02 DIAGNOSIS — F17210 Nicotine dependence, cigarettes, uncomplicated: Secondary | ICD-10-CM | POA: Insufficient documentation

## 2022-08-02 DIAGNOSIS — N390 Urinary tract infection, site not specified: Secondary | ICD-10-CM | POA: Diagnosis not present

## 2022-08-02 DIAGNOSIS — Z7902 Long term (current) use of antithrombotics/antiplatelets: Secondary | ICD-10-CM | POA: Diagnosis not present

## 2022-08-02 DIAGNOSIS — I10 Essential (primary) hypertension: Secondary | ICD-10-CM | POA: Diagnosis not present

## 2022-08-02 DIAGNOSIS — E1142 Type 2 diabetes mellitus with diabetic polyneuropathy: Secondary | ICD-10-CM | POA: Diagnosis present

## 2022-08-02 DIAGNOSIS — Z741 Need for assistance with personal care: Secondary | ICD-10-CM

## 2022-08-02 DIAGNOSIS — Z7409 Other reduced mobility: Secondary | ICD-10-CM | POA: Diagnosis present

## 2022-08-02 HISTORY — DX: Urinary tract infection, site not specified: N39.0

## 2022-08-02 LAB — CBC WITH DIFFERENTIAL/PLATELET
Abs Immature Granulocytes: 0.02 10*3/uL (ref 0.00–0.07)
Basophils Absolute: 0 10*3/uL (ref 0.0–0.1)
Basophils Relative: 0 %
Eosinophils Absolute: 0.1 10*3/uL (ref 0.0–0.5)
Eosinophils Relative: 2 %
HCT: 34.6 % — ABNORMAL LOW (ref 36.0–46.0)
Hemoglobin: 10.7 g/dL — ABNORMAL LOW (ref 12.0–15.0)
Immature Granulocytes: 0 %
Lymphocytes Relative: 28 %
Lymphs Abs: 2.1 10*3/uL (ref 0.7–4.0)
MCH: 28.5 pg (ref 26.0–34.0)
MCHC: 30.9 g/dL (ref 30.0–36.0)
MCV: 92 fL (ref 80.0–100.0)
Monocytes Absolute: 0.6 10*3/uL (ref 0.1–1.0)
Monocytes Relative: 9 %
Neutro Abs: 4.5 10*3/uL (ref 1.7–7.7)
Neutrophils Relative %: 61 %
Platelets: 295 10*3/uL (ref 150–400)
RBC: 3.76 MIL/uL — ABNORMAL LOW (ref 3.87–5.11)
RDW: 14 % (ref 11.5–15.5)
WBC: 7.3 10*3/uL (ref 4.0–10.5)
nRBC: 0 % (ref 0.0–0.2)

## 2022-08-02 LAB — URINALYSIS, ROUTINE W REFLEX MICROSCOPIC
Bilirubin Urine: NEGATIVE
Glucose, UA: >=500 mg/dL — AB
Ketones, ur: NEGATIVE mg/dL
Nitrite: NEGATIVE
Protein, ur: 30 mg/dL — AB
RBC / HPF: >50 RBC/hpf — ABNORMAL HIGH (ref 0–5)
Specific Gravity, Urine: 1.021 (ref 1.005–1.030)
WBC, UA: >50 WBC/hpf — ABNORMAL HIGH (ref 0–5)
pH: 5 (ref 5.0–8.0)

## 2022-08-02 LAB — I-STAT CHEM 8, ED
BUN: 20 mg/dL (ref 8–23)
Calcium, Ion: 1.2 mmol/L (ref 1.15–1.40)
Chloride: 106 mmol/L (ref 98–111)
Creatinine, Ser: 1 mg/dL (ref 0.44–1.00)
Glucose, Bld: 166 mg/dL — ABNORMAL HIGH (ref 70–99)
HCT: 35 % — ABNORMAL LOW (ref 36.0–46.0)
Hemoglobin: 11.9 g/dL — ABNORMAL LOW (ref 12.0–15.0)
Potassium: 4.3 mmol/L (ref 3.5–5.1)
Sodium: 141 mmol/L (ref 135–145)
TCO2: 27 mmol/L (ref 22–32)

## 2022-08-02 LAB — COMPREHENSIVE METABOLIC PANEL
ALT: 11 U/L (ref 0–44)
AST: 15 U/L (ref 15–41)
Albumin: 3.5 g/dL (ref 3.5–5.0)
Alkaline Phosphatase: 96 U/L (ref 38–126)
Anion gap: 8 (ref 5–15)
BUN: 22 mg/dL (ref 8–23)
CO2: 25 mmol/L (ref 22–32)
Calcium: 9 mg/dL (ref 8.9–10.3)
Chloride: 105 mmol/L (ref 98–111)
Creatinine, Ser: 0.9 mg/dL (ref 0.44–1.00)
GFR, Estimated: >60 mL/min (ref >60)
Glucose, Bld: 172 mg/dL — ABNORMAL HIGH (ref 70–99)
Potassium: 4.3 mmol/L (ref 3.5–5.1)
Sodium: 138 mmol/L (ref 135–145)
Total Bilirubin: 1 mg/dL (ref 0.3–1.2)
Total Protein: 7.4 g/dL (ref 6.5–8.1)

## 2022-08-02 LAB — LACTIC ACID, PLASMA
Lactic Acid, Venous: 1.2 mmol/L (ref 0.5–1.9)
Lactic Acid, Venous: 1.9 mmol/L (ref 0.5–1.9)

## 2022-08-02 LAB — PROTIME-INR
INR: 1.1 (ref 0.8–1.2)
Prothrombin Time: 13.7 seconds (ref 11.4–15.2)

## 2022-08-02 LAB — TROPONIN I (HIGH SENSITIVITY)
Troponin I (High Sensitivity): 5 ng/L (ref <18)
Troponin I (High Sensitivity): 5 ng/L (ref <18)

## 2022-08-02 LAB — MAGNESIUM: Magnesium: 1.8 mg/dL (ref 1.7–2.4)

## 2022-08-02 MED ORDER — ONDANSETRON HCL 4 MG PO TABS: 4.0000 mg | ORAL_TABLET | Freq: Four times a day (QID) | ORAL | Status: DC | PRN

## 2022-08-02 MED ORDER — SODIUM CHLORIDE 0.9% FLUSH: 3.0000 mL | INTRAVENOUS | Status: DC | PRN

## 2022-08-02 MED ORDER — ALPRAZOLAM 1 MG PO TABS
1.0000 mg | ORAL_TABLET | Freq: Every day | ORAL | Status: DC
  Administered 2022-08-02 – 2022-08-03 (×2): 1 mg via ORAL
  Filled 2022-08-02: qty 1
  Filled 2022-08-02: qty 2

## 2022-08-02 MED ORDER — LISINOPRIL 10 MG PO TABS
40.0000 mg | ORAL_TABLET | Freq: Every day | ORAL | Status: DC
  Administered 2022-08-02 – 2022-08-03 (×2): 40 mg via ORAL
  Filled 2022-08-02 (×2): qty 4

## 2022-08-02 MED ORDER — ZOLPIDEM TARTRATE 5 MG PO TABS: 5.0000 mg | ORAL_TABLET | Freq: Every evening | ORAL | Status: DC | PRN

## 2022-08-02 MED ORDER — HYDROCODONE-ACETAMINOPHEN 7.5-325 MG PO TABS: 1.0000 | ORAL_TABLET | Freq: Four times a day (QID) | ORAL | Status: DC | PRN

## 2022-08-02 MED ORDER — SODIUM CHLORIDE 0.9 % IV SOLN
2.0000 g | Freq: Once | INTRAVENOUS | Status: AC
  Administered 2022-08-02: 2 g via INTRAVENOUS
  Filled 2022-08-02: qty 20

## 2022-08-02 MED ORDER — ONDANSETRON HCL 4 MG/2ML IJ SOLN: 4.0000 mg | Freq: Four times a day (QID) | INTRAMUSCULAR | Status: DC | PRN

## 2022-08-02 MED ORDER — OXYCODONE HCL 5 MG PO TABS
10.0000 mg | ORAL_TABLET | ORAL | Status: DC | PRN
  Administered 2022-08-03: 10 mg via ORAL
  Filled 2022-08-02: qty 2

## 2022-08-02 MED ORDER — ATORVASTATIN CALCIUM 40 MG PO TABS
40.0000 mg | ORAL_TABLET | Freq: Every day | ORAL | Status: DC
  Administered 2022-08-02 – 2022-08-03 (×2): 40 mg via ORAL
  Filled 2022-08-02 (×2): qty 1

## 2022-08-02 MED ORDER — SODIUM CHLORIDE 0.9 % IV SOLN
1.0000 g | INTRAVENOUS | Status: DC
  Administered 2022-08-03 – 2022-08-04 (×2): 1 g via INTRAVENOUS
  Filled 2022-08-02 (×2): qty 10

## 2022-08-02 MED ORDER — GLIPIZIDE 5 MG PO TABS
2.5000 mg | ORAL_TABLET | Freq: Two times a day (BID) | ORAL | Status: DC
  Administered 2022-08-03 (×2): 2.5 mg via ORAL
  Filled 2022-08-02 (×2): qty 1

## 2022-08-02 MED ORDER — SODIUM CHLORIDE 0.9% FLUSH
3.0000 mL | Freq: Two times a day (BID) | INTRAVENOUS | Status: DC
  Administered 2022-08-02 – 2022-08-04 (×4): 3 mL via INTRAVENOUS

## 2022-08-02 MED ORDER — POLYETHYLENE GLYCOL 3350 17 G PO PACK: 17.0000 g | PACK | Freq: Every day | ORAL | Status: DC | PRN

## 2022-08-02 MED ORDER — LACTATED RINGERS IV BOLUS (SEPSIS)
1000.0000 mL | Freq: Once | INTRAVENOUS | Status: AC
  Administered 2022-08-02: 1000 mL via INTRAVENOUS

## 2022-08-02 MED ORDER — ENOXAPARIN SODIUM 40 MG/0.4ML IJ SOSY
40.0000 mg | PREFILLED_SYRINGE | INTRAMUSCULAR | Status: DC
  Administered 2022-08-02 – 2022-08-03 (×2): 40 mg via SUBCUTANEOUS
  Filled 2022-08-02 (×2): qty 0.4

## 2022-08-02 MED ORDER — INSULIN ASPART 100 UNIT/ML IJ SOLN
0.0000 [IU] | Freq: Three times a day (TID) | INTRAMUSCULAR | Status: DC
  Administered 2022-08-03: 2 [IU] via SUBCUTANEOUS
  Administered 2022-08-04: 3 [IU] via SUBCUTANEOUS

## 2022-08-02 MED ORDER — METOPROLOL SUCCINATE ER 50 MG PO TB24
50.0000 mg | ORAL_TABLET | Freq: Every day | ORAL | Status: DC
  Administered 2022-08-03 – 2022-08-04 (×2): 50 mg via ORAL
  Filled 2022-08-02 (×2): qty 1

## 2022-08-02 MED ORDER — SODIUM CHLORIDE 0.9 % IV SOLN: 250.0000 mL | INTRAVENOUS | Status: DC | PRN

## 2022-08-02 MED ORDER — VITAMIN B-12 1000 MCG PO TABS
1000.0000 ug | ORAL_TABLET | Freq: Every day | ORAL | Status: DC
  Administered 2022-08-03 – 2022-08-04 (×2): 1000 ug via ORAL
  Filled 2022-08-02 (×2): qty 1

## 2022-08-02 MED ORDER — HYDROCODONE-ACETAMINOPHEN 5-325 MG PO TABS: 1.5000 | ORAL_TABLET | Freq: Four times a day (QID) | ORAL | Status: DC | PRN

## 2022-08-02 MED ORDER — TRAMADOL HCL 50 MG PO TABS
100.0000 mg | ORAL_TABLET | Freq: Four times a day (QID) | ORAL | Status: DC | PRN
  Administered 2022-08-03: 100 mg via ORAL
  Filled 2022-08-02: qty 2

## 2022-08-02 NOTE — ED Triage Notes (Signed)
Pt with burning with urination, per pt x 3 months.  Pt is incont of urine.  Also left groin pain.  Family reports pt is not eating and has lost weight over the past year.   Reported that pt was in nursing home and son removed pt.  Reported that pt lives with her son currently.

## 2022-08-02 NOTE — H&P (Addendum)
History and Physical    Candice Mccann:[id number] DOB: Jan 11, 1956 DOA: 08/02/2022  PCP: Leeanne Rio, MD  Patient coming from: Home  I have personally briefly reviewed patient's old medical records in [place]  Chief Complaint: Presents at behest of her sister-in-law who went to visit her today and found her in poor condition.  HPI: Candice Mccann is a 66 y.o. female with medical history significant of failure to thrive, dysuria, anxiety, and arthritis, colon cancer, diabetes type 2 with neuropathy, nephrolithiasis, hypertension, hyperlipidemia inability to get out of bed on her own who is dependent for all ADLs who presented to the emergency department because she has been having ongoing dysuria for the past 3 months.  She states that she was initially treated for this with a course of antibiotics which she has completed.  Prior to 3 months ago she was residing in a skilled nursing facility and has been bedbound since a prior stroke years ago.  Months ago she completed her skilled nursing facility stay and came to live with her son.  The patient's sister-in-law reports that she is alone throughout the day while her son is at work.  Incontinent of urine and of stool functionally and sits until her son gets home in a soiled diaper.  Patient's sister-in-law is able to check on her once a week.  Today the patient seem to have worsened with generalized weakness.  The patient reports dysuria in addition to left groin pain.  Her groin pain is chronic.  She has been able to eat and drink but has not had any recent insensate fluid losses.  Had a bowel movement yesterday she takes multiple blood pressure medications and did take her doses this morning.  ED Course: Noted that patient is unable to receive basic care at home in terms of her living needs.  Was noted to have an abnormal urinalysis.  She now requires IV antibiotics until when culture is available.  She was referred to medicine for further  evaluation and management.  Review of patient's record from [facility] reveals multiple organisms causing UTIs in the past 12 months including Candida glabrata, E. coli, Proteus, Candida albicans.  It is believed that her multiple UTIs are related to cross-contamination from soiled briefs.  She is also a diabetic and is at risk of fungal infections and has had fungal UTIs.  Review of Systems: As per HPI otherwise all other systems reviewed and  negative.   Past Medical History:  Diagnosis Date   Anxiety    Arthritis    back   Colon cancer ([place])    Diabetes ([place])    Headache    History of kidney stones    Hypertension    UTI (urinary tract infection)     Past Surgical History:  Procedure Laterality Date   ABDOMINAL HYSTERECTOMY     BACK SURGERY     CHOLECYSTECTOMY     COLON SURGERY  2009   colon cancer   COLONOSCOPY  09/2017   Dr. Burke Keels: Normal colonoscopy   ESOPHAGOGASTRODUODENOSCOPY (EGD) WITH PROPOFOL N/A 06/15/2018   Dr. Gala Romney: schatzki ring s/p dilation. mild reflux esophagitis.    EUS  2016   Dr. Christoper Fabian    HERNIA REPAIR     MALONEY DILATION N/A 06/15/2018   Procedure: Venia Minks DILATION;  Surgeon: Daneil Dolin, MD;  Location: [facility];  Service: Endoscopy;  Laterality: N/A;    Social History   Social History Narrative  Not on file     reports that she has been smoking cigarettes. She has never used smokeless tobacco. She reports that she does not currently use alcohol. She reports that she does not use drugs. She now lives with her son in [place].  Allergies  Allergen Reactions   Contrast Media [Iodinated Contrast Media] Anaphylaxis and Nausea And Vomiting   Nitroglycerin Other (See Comments)    Cardiac arrest   Tomato Itching   Sulfa Antibiotics Rash    History reviewed. No pertinent family history.   Prior to Admission medications   Medication Sig Start Date End Date Taking? Authorizing Provider  atorvastatin (LIPITOR) 40  MG tablet Take 40 mg by mouth daily. 07/08/22  Yes [provider]  glipiZIDE (GLUCOTROL) 5 MG tablet Take 2.5 mg by mouth 2 (two) times daily before a meal. 07/08/22  Yes [provider]  HYDROcodone-acetaminophen (NORCO) 7.5-325 MG tablet Take 1 tablet by mouth every 6 (six) hours as needed for severe pain.   Yes [provider]  lisinopril (ZESTRIL) 40 MG tablet Take 40 mg by mouth daily.   Yes [provider]  metoprolol succinate (TOPROL-XL) 50 MG 24 hr tablet Take 1 tablet by mouth daily. 07/08/22  Yes [provider]  polyethylene glycol (MIRALAX / GLYCOLAX) 17 g packet Take 17 g by mouth daily as needed for moderate constipation.    Yes [provider]  vitamin B-12 (CYANOCOBALAMIN) 1000 MCG tablet Take 1,000 mcg by mouth daily.   Yes [provider]  ALPRAZolam Duanne Moron) 1 MG tablet Take 1 mg by mouth at bedtime.    [provider]  clopidogrel (PLAVIX) 75 MG tablet Take 1 tablet (75 mg total) by mouth daily. Patient not taking: Reported on 08/02/2022 10/17/19   Penumalli, Earlean Polka, MD  diphenhydramine-acetaminophen (TYLENOL PM) 25-500 MG TABS tablet Take 2 tablets by mouth at bedtime as needed. Patient not taking: Reported on 08/02/2022 02/08/18   Florencia Reasons, MD  pantoprazole (PROTONIX) 40 MG tablet TAKE 1 TABLET(40 MG) BY MOUTH DAILY BEFORE BREAKFAST Patient not taking: Reported on 08/02/2022 11/21/20   Carlis Stable, NP  tiZANidine (ZANAFLEX) 4 MG capsule Take 1 capsule (4 mg total) by mouth at bedtime as needed for muscle spasms. Patient not taking: Reported on 08/02/2022 02/08/18   Florencia Reasons, MD    Physical Exam:  Constitutional: NAD, calm, comfortable Vitals:   08/02/22 1145 08/02/22 1200 08/02/22 1400 08/02/22 1515  BP: 108/80 112/76 107/69 135/83  Pulse: 76 76 80 (!) 47  Resp: '13 14 15 14  '$ Temp:      TempSrc:      SpO2: 100% 97% 99% 92%  Weight:      Height:       Eyes: PERRL, lids and conjunctivae normal ENMT:  Mucous membranes are moist. Posterior pharynx clear of any exudate or lesions.Normal dentition.  Neck: normal, supple, no masses, no thyromegaly Respiratory: clear to auscultation bilaterally, no wheezing, no crackles. Normal respiratory effort. No accessory muscle use.  Cardiovascular: Regular rate and rhythm, no murmurs / rubs / gallops. No extremity edema. 2+ pedal pulses. No carotid bruits.  Abdomen: no tenderness, no masses palpated. No hepatosplenomegaly. Bowel sounds positive.  Musculoskeletal: no clubbing / cyanosis. No joint deformity upper and lower extremities. Good ROM, no contractures. Normal muscle tone.  Skin: no rashes, lesions, ulcers. No induration Neurologic: CN 2-12 grossly intact. Sensation intact, DTR normal. Strength 5/5 in all 4.  Psychiatric: Normal judgment and insight. Alert and oriented  x 3. Normal mood.    Labs on Admission: I have personally reviewed following labs and imaging studies  CBC: Recent Labs  Lab 08/02/22 1152 08/02/22 1222  WBC 7.3  --   NEUTROABS 4.5  --   HGB 10.7* 11.9*  HCT 34.6* 35.0*  MCV 92.0  --   PLT 295  --    Basic Metabolic Panel: Recent Labs  Lab 08/02/22 1152 08/02/22 1222  NA 138 141  K 4.3 4.3  CL 105 106  CO2 25  --   GLUCOSE 172* 166*  BUN 22 20  CREATININE 0.90 1.00  CALCIUM 9.0  --   MG 1.8  --    GFR: Estimated Creatinine Clearance: 43 mL/min (by C-G formula based on SCr of 1 mg/dL). Liver Function Tests: Recent Labs  Lab 08/02/22 1152  AST 15  ALT 11  ALKPHOS 96  BILITOT 1.0  PROT 7.4  ALBUMIN 3.5    Coagulation Profile: Recent Labs  Lab 08/02/22 1152  INR 1.1    Urine analysis:    Component Value Date/Time   COLORURINE YELLOW 08/02/2022 1500   APPEARANCEUR CLOUDY (A) 08/02/2022 1500   LABSPEC 1.021 08/02/2022 1500   PHURINE 5.0 08/02/2022 1500   GLUCOSEU >=500 (A) 08/02/2022 1500   HGBUR MODERATE (A) 08/02/2022 1500   BILIRUBINUR NEGATIVE 08/02/2022 1500   KETONESUR NEGATIVE  08/02/2022 1500   PROTEINUR 30 (A) 08/02/2022 1500   NITRITE NEGATIVE 08/02/2022 1500   LEUKOCYTESUR LARGE (A) 08/02/2022 1500    Radiological Exams on Admission: CT Head Wo Contrast  Result Date: 08/02/2022 CLINICAL DATA:  Mental status changes of unknown cause, burning with urination for 3 months, incontinent, LEFT groin pain, anorexia and weight loss over past year EXAM: CT HEAD WITHOUT CONTRAST TECHNIQUE: Contiguous axial images were obtained from the base of the skull through the vertex without intravenous contrast. RADIATION DOSE REDUCTION: This exam was performed according to the departmental dose-optimization program which includes automated exposure control, adjustment of the mA and/or kV according to patient size and/or use of iterative reconstruction technique. COMPARISON:  None Available. FINDINGS: Brain: Generalized atrophy. Normal ventricular morphology. No midline shift or mass effect. Small vessel chronic ischemic changes of deep cerebral white matter. No intracranial hemorrhage, mass lesion, evidence of acute infarction, or extra-axial fluid collection. Vascular: No hyperdense vessels. Atherosclerotic calcifications of internal carotid arteries at skull base Skull: Intact Sinuses/Orbits: Clear Other: N/A IMPRESSION: Atrophy with small vessel chronic ischemic changes of deep cerebral white matter. No acute intracranial abnormalities. Electronically Signed   By: Lavonia Dana M.D.   On: 08/02/2022 15:24   CT CHEST ABDOMEN PELVIS WO CONTRAST  Result Date: 08/02/2022 CLINICAL DATA:  Sepsis, burning with urination for 3 months, incontinence, left groin pain, loss of appetite and weight loss EXAM: CT CHEST, ABDOMEN AND PELVIS WITHOUT CONTRAST TECHNIQUE: Multidetector CT imaging of the chest, abdomen and pelvis was performed following the standard protocol without IV contrast. RADIATION DOSE REDUCTION: This exam was performed according to the departmental dose-optimization program which includes  automated exposure control, adjustment of the mA and/or kV according to patient size and/or use of iterative reconstruction technique. COMPARISON:  CT abdomen pelvis, 07/02/2022, CT chest, 11/30/2021 FINDINGS: CT CHEST FINDINGS Cardiovascular: Aortic atherosclerosis. Normal heart size. Three-vessel coronary artery calcifications. No pericardial effusion. Mediastinum/Nodes: No enlarged mediastinal, hilar, or axillary lymph nodes. Thyroid gland, trachea, and esophagus demonstrate no significant findings. Lungs/Pleura: Mild, bland appearing scarring of the bilateral lung bases. No pleural effusion or pneumothorax. Musculoskeletal: No  chest wall abnormality. Unchanged, mild wedge deformity of the T8 vertebral body (series 7, image 81) CT ABDOMEN PELVIS FINDINGS Hepatobiliary: No focal liver abnormality is seen. Status post cholecystectomy. No biliary dilatation. Pancreas: Unremarkable. No pancreatic ductal dilatation or surrounding inflammatory changes. Spleen: Normal in size without significant abnormality. Adrenals/Urinary Tract: Adrenal glands are unremarkable. Kidneys are normal, without renal calculi, solid lesion, or hydronephrosis. Bladder is unremarkable. Stomach/Bowel: Stomach is within normal limits. Appendix is not clearly visualized. No evidence of bowel wall thickening, distention, or inflammatory changes. Status post sigmoid colon resection and reanastomosis. Vascular/Lymphatic: Aortic atherosclerosis. No enlarged abdominal or pelvic lymph nodes. Reproductive: Status post hysterectomy. Other: No abdominal wall hernia or abnormality. No ascites. Musculoskeletal: No acute osseous findings. IMPRESSION: 1. No acute noncontrast CT findings of the chest, abdomen, or pelvis to explain sepsis, urinary symptoms, or weight loss. 2. Status post sigmoid colon resection. 3. Status post cholecystectomy and hysterectomy. 4. Coronary artery disease. Aortic Atherosclerosis (ICD10-I70.0). Electronically Signed   By: Delanna Ahmadi M.D.   On: 08/02/2022 15:20   DG Chest Port 1 View  Result Date: 08/02/2022 CLINICAL DATA:  Provided history: Questionable sepsis-evaluate for abnormality. Additional history provided: Burning with urination, urinary incontinence, confusion. EXAM: PORTABLE CHEST 1 VIEW COMPARISON:  Prior chest radiographs 11/29/2021 and earlier. CT angiogram chest 11/29/2021. FINDINGS: Heart size within normal limits. Aortic atherosclerosis. Small ill-defined opacity within the left lung base. No appreciable airspace consolidation on the right. No evidence of pleural effusion or pneumothorax. Degenerative changes of the spine. IMPRESSION: Small ill-defined opacity within the left lung base, which may reflect atelectasis or early pneumonia. Correlate clinically and consider short-interval radiographic follow-up. Aortic Atherosclerosis (ICD10-I70.0). Electronically Signed   By: Kellie Simmering D.O.   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  Acute cystitis with hematuria: Blood cultures and urine cultures are pending.  Patient has been given a dose of ceftriaxone 2 g in the emergency department.  Will order ceftriaxone 1 g IV every 24 hours pending results of urine culture.  2.  Type 2 diabetes mellitus with diabetic neuropathy: Continue home glipizide, however patient noted to be hyperglycemic.  Will monitor fingerstick blood glucoses and adjust diabetic regimen as necessary.  3.  Dependent for most ADLs: Patient unable to care for herself.  Lack of care at home is unfortunately  resulting in multiple urinary tract infections with hospitalizations.  We will admit patient to the hospital consult case management and social work and await their recommendations regarding care at home versus skilled facility.  DVT prophylaxis: Lovenox Code Status: Full code Family Communication: Spoke with patient's sister-in-law Lannette Donath who was present at the time of admission.  She was on the phone with the patient's son and communicated with him. Disposition Plan: Home versus skilled facility versus home health Consults called: None Admission status: Inpatient 75 minutes spent in care and coordination.   Lady Deutscher MD FACP [facility] Pager [phone]  How to contact the [facility] Attending or Consulting provider [place] or  covering provider during after hours [place], for this patient?  Check the care team in [facility] and look for a) attending/consulting [facility] provider listed and b) the [facility] team listed Log into [email or web address] and use [place]'s universal password to access. If you do not have the password, please contact the hospital operator. Locate the [facility] provider you are looking for under [facility] and page to a number that you can be directly reached. If you still have difficulty reaching the provider, please page the [facility] (Director on Call) for the Hospitalists listed on amion for assistance.  If 7PM-7AM, please contact night-coverage [email or web address] Password [facility]  08/02/2022, 6:34 PM

## 2022-08-02 NOTE — ED Provider Notes (Signed)
  Physical Exam  BP 135/83   Pulse (!) 47   Temp 98.1 F (36.7 C) (Oral)   Resp 14   Ht '5\' 2"'$  (1.575 m)   Wt 49.9 kg   SpO2 92%   BMI 20.12 kg/m   Physical Exam Vitals and nursing note reviewed.  Constitutional:      General: She is not in acute distress.    Appearance: She is well-developed.  HENT:     Head: Normocephalic and atraumatic.  Eyes:     Conjunctiva/sclera: Conjunctivae normal.  Cardiovascular:     Rate and Rhythm: Normal rate and regular rhythm.     Heart sounds: No murmur heard. Pulmonary:     Effort: Pulmonary effort is normal. No respiratory distress.     Breath sounds: Normal breath sounds.  Abdominal:     Palpations: Abdomen is soft.     Tenderness: There is no abdominal tenderness.  Musculoskeletal:        General: No swelling.     Cervical back: Neck supple.  Skin:    General: Skin is warm and dry.     Capillary Refill: Capillary refill takes less than 2 seconds.  Neurological:     Mental Status: She is alert.  Psychiatric:        Mood and Affect: Mood normal.     Procedures  Procedures  ED Course / MDM   Clinical Course as of 08/02/22 1740  Mon Aug 02, 2022  1512 Failure to thrive, pending CT and UA [MK]    Clinical Course User Index [MK] Donnavan Covault, MD   Medical Decision Making Amount and/or Complexity of Data Reviewed Labs: ordered. Radiology: ordered. ECG/medicine tests: ordered.   Patient received in handoff.  Failure to thrive currently living at home.  Endorsing dysuria and generalized weakness.  CT imaging reassuringly unremarkable but patient does have a urinary tract infection and will require IV antibiotics and hospital admission.  TOC consult was placed to assist the patient with home health versus SNF placement.  Patient admitted for inability to perform ADLs in the setting of a urinary tract infection.       Teressa Lower, MD 08/02/22 [phone]

## 2022-08-02 NOTE — ED Provider Notes (Signed)
[facility] EMERGENCY DEPARTMENT Provider Note   CSN: [id number] Arrival date & time: 08/02/22  1102     History  Chief Complaint  Patient presents with   Dysuria   Hypotension    Candice Mccann is a 66 y.o. female.   Dysuria Patient presents for failure to thrive, dysuria.  Medical history includes anxiety, arthritis, colon cancer, DM, nephrolithiasis, HTN, HLD.  Patient states that she has been having ongoing dysuria for the past 3 months.  She states that she was initially treated for this with a course of antibiotics, which she completed.  Prior to 3 months ago, she was residing in skilled nursing facility.  She has been bedbound since a prior stroke.  3 months ago, she was taken out of skilled nursing facility to live with her son.  Patient's sister-in-law reports that the patient is left alone throughout the day while her son is at work.  She is incontinent of urine and often sits in a soiled diaper.  Patient sister-in-law checks on her once a week.  Today, she seemed to have worsening generalized weakness.  Patient, herself, reports dysuria in addition to left groin pain.  She states that her left groin pain is chronic.  She states that she has been able to eat and drink and has not had any recent fluid losses.  Her last bowel movement was yesterday.  Patient does take multiple blood pressure medications and did take her doses this morning.     Home Medications Prior to Admission medications   Medication Sig Start Date End Date Taking? Authorizing Provider  atorvastatin (LIPITOR) 40 MG tablet Take 40 mg by mouth daily. 07/08/22  Yes [provider]  glipiZIDE (GLUCOTROL) 5 MG tablet Take 2.5 mg by mouth 2 (two) times daily before a meal. 07/08/22  Yes [provider]  HYDROcodone-acetaminophen (NORCO) 7.5-325 MG tablet Take 1 tablet by mouth every 6 (six) hours as needed for severe pain.   Yes [provider]  lisinopril (ZESTRIL) 40 MG tablet Take 40 mg by  mouth daily.   Yes [provider]  metoprolol succinate (TOPROL-XL) 50 MG 24 hr tablet Take 1 tablet by mouth daily. 07/08/22  Yes [provider]  polyethylene glycol (MIRALAX / GLYCOLAX) 17 g packet Take 17 g by mouth daily as needed for moderate constipation.    Yes [provider]  vitamin B-12 (CYANOCOBALAMIN) 1000 MCG tablet Take 1,000 mcg by mouth daily.   Yes [provider]  ALPRAZolam Duanne Moron) 1 MG tablet Take 1 mg by mouth at bedtime.    [provider]  clopidogrel (PLAVIX) 75 MG tablet Take 1 tablet (75 mg total) by mouth daily. Patient not taking: Reported on 08/02/2022 10/17/19   Penumalli, Earlean Polka, MD  diphenhydramine-acetaminophen (TYLENOL PM) 25-500 MG TABS tablet Take 2 tablets by mouth at bedtime as needed. Patient not taking: Reported on 08/02/2022 02/08/18   Florencia Reasons, MD  pantoprazole (PROTONIX) 40 MG tablet TAKE 1 TABLET(40 MG) BY MOUTH DAILY BEFORE BREAKFAST Patient not taking: Reported on 08/02/2022 11/21/20   Carlis Stable, NP  tiZANidine (ZANAFLEX) 4 MG capsule Take 1 capsule (4 mg total) by mouth at bedtime as needed for muscle spasms. Patient not taking: Reported on 08/02/2022 02/08/18   Florencia Reasons, MD      Allergies    Contrast media [iodinated contrast media], Nitroglycerin, Tomato, and Sulfa antibiotics    Review of Systems   Review of Systems  Genitourinary:  Positive for  dysuria.  Neurological:  Positive for weakness (Generalized).  All other systems reviewed and are negative.   Physical Exam Updated Vital Signs BP 135/83   Pulse (!) 47   Temp 98.1 F (36.7 C) (Oral)   Resp 14   Ht '5\' 2"'$  (1.575 m)   Wt 49.9 kg   SpO2 92%   BMI 20.12 kg/m  Physical Exam Vitals and nursing note reviewed.  Constitutional:      General: She is not in acute distress.    Appearance: Normal appearance. She is well-developed. She is ill-appearing (Chronically). She is not toxic-appearing or diaphoretic.  HENT:     Head:  Normocephalic and atraumatic.     Right Ear: External ear normal.     Left Ear: External ear normal.     Nose: Nose normal.     Mouth/Throat:     Mouth: Mucous membranes are moist.     Pharynx: Oropharynx is clear.  Eyes:     Extraocular Movements: Extraocular movements intact.     Conjunctiva/sclera: Conjunctivae normal.  Cardiovascular:     Rate and Rhythm: Normal rate and regular rhythm.     Heart sounds: No murmur heard. Pulmonary:     Effort: Pulmonary effort is normal. No respiratory distress.     Breath sounds: Normal breath sounds. No wheezing or rales.  Chest:     Chest wall: No tenderness.  Abdominal:     General: There is no distension.     Palpations: Abdomen is soft.     Tenderness: There is no abdominal tenderness.  Musculoskeletal:        General: No swelling.     Cervical back: Normal range of motion and neck supple.     Right lower leg: No edema.     Left lower leg: No edema.     Comments: Atrophy of lower extremities  Skin:    General: Skin is warm and dry.     Capillary Refill: Capillary refill takes less than 2 seconds.     Coloration: Skin is not jaundiced or pale.  Neurological:     General: No focal deficit present.     Mental Status: She is alert and oriented to person, place, and time.  Psychiatric:        Mood and Affect: Mood normal.        Behavior: Behavior normal.        Thought Content: Thought content normal.        Judgment: Judgment normal.     ED Results / Procedures / Treatments   Labs (all labs ordered are listed, but only abnormal results are displayed) Labs Reviewed  COMPREHENSIVE METABOLIC PANEL - Abnormal; Notable for the following components:      Result Value   Glucose, Bld 172 (*)    All other components within normal limits  CBC WITH DIFFERENTIAL/PLATELET - Abnormal; Notable for the following components:   RBC 3.76 (*)    Hemoglobin 10.7 (*)    HCT 34.6 (*)    All other components within normal limits  I-STAT CHEM 8,  ED - Abnormal; Notable for the following components:   Glucose, Bld 166 (*)    Hemoglobin 11.9 (*)    HCT 35.0 (*)    All other components within normal limits  CULTURE, BLOOD (ROUTINE X 2)  CULTURE, BLOOD (ROUTINE X 2)  URINE CULTURE  LACTIC ACID, PLASMA  LACTIC ACID, PLASMA  PROTIME-INR  MAGNESIUM  URINALYSIS, ROUTINE W REFLEX MICROSCOPIC  TROPONIN I (  HIGH SENSITIVITY)  TROPONIN I (HIGH SENSITIVITY)    EKG EKG Interpretation  Date/Time:  Monday August 02 2022 11:46:45 EST Ventricular Rate:  80 PR Interval:  140 QRS Duration: 90 QT Interval:  389 QTC Calculation: 449 R Axis:   57 Text Interpretation: Sinus rhythm Confirmed by Godfrey Pick ([id number]) on 08/02/2022 1:07:05 PM  Radiology CT Head Wo Contrast  Result Date: 08/02/2022 CLINICAL DATA:  Mental status changes of unknown cause, burning with urination for 3 months, incontinent, LEFT groin pain, anorexia and weight loss over past year EXAM: CT HEAD WITHOUT CONTRAST TECHNIQUE: Contiguous axial images were obtained from the base of the skull through the vertex without intravenous contrast. RADIATION DOSE REDUCTION: This exam was performed according to the departmental dose-optimization program which includes automated exposure control, adjustment of the mA and/or kV according to patient size and/or use of iterative reconstruction technique. COMPARISON:  None Available. FINDINGS: Brain: Generalized atrophy. Normal ventricular morphology. No midline shift or mass effect. Small vessel chronic ischemic changes of deep cerebral white matter. No intracranial hemorrhage, mass lesion, evidence of acute infarction, or extra-axial fluid collection. Vascular: No hyperdense vessels. Atherosclerotic calcifications of internal carotid arteries at skull base Skull: Intact Sinuses/Orbits: Clear Other: N/A IMPRESSION: Atrophy with small vessel chronic ischemic changes of deep cerebral white matter. No acute intracranial abnormalities. Electronically  Signed   By: Lavonia Dana M.D.   On: 08/02/2022 15:24   CT CHEST ABDOMEN PELVIS WO CONTRAST  Result Date: 08/02/2022 CLINICAL DATA:  Sepsis, burning with urination for 3 months, incontinence, left groin pain, loss of appetite and weight loss EXAM: CT CHEST, ABDOMEN AND PELVIS WITHOUT CONTRAST TECHNIQUE: Multidetector CT imaging of the chest, abdomen and pelvis was performed following the standard protocol without IV contrast. RADIATION DOSE REDUCTION: This exam was performed according to the departmental dose-optimization program which includes automated exposure control, adjustment of the mA and/or kV according to patient size and/or use of iterative reconstruction technique. COMPARISON:  CT abdomen pelvis, 07/02/2022, CT chest, 11/30/2021 FINDINGS: CT CHEST FINDINGS Cardiovascular: Aortic atherosclerosis. Normal heart size. Three-vessel coronary artery calcifications. No pericardial effusion. Mediastinum/Nodes: No enlarged mediastinal, hilar, or axillary lymph nodes. Thyroid gland, trachea, and esophagus demonstrate no significant findings. Lungs/Pleura: Mild, bland appearing scarring of the bilateral lung bases. No pleural effusion or pneumothorax. Musculoskeletal: No chest wall abnormality. Unchanged, mild wedge deformity of the T8 vertebral body (series 7, image 81) CT ABDOMEN PELVIS FINDINGS Hepatobiliary: No focal liver abnormality is seen. Status post cholecystectomy. No biliary dilatation. Pancreas: Unremarkable. No pancreatic ductal dilatation or surrounding inflammatory changes. Spleen: Normal in size without significant abnormality. Adrenals/Urinary Tract: Adrenal glands are unremarkable. Kidneys are normal, without renal calculi, solid lesion, or hydronephrosis. Bladder is unremarkable. Stomach/Bowel: Stomach is within normal limits. Appendix is not clearly visualized. No evidence of bowel wall thickening, distention, or inflammatory changes. Status post sigmoid colon resection and reanastomosis.  Vascular/Lymphatic: Aortic atherosclerosis. No enlarged abdominal or pelvic lymph nodes. Reproductive: Status post hysterectomy. Other: No abdominal wall hernia or abnormality. No ascites. Musculoskeletal: No acute osseous findings. IMPRESSION: 1. No acute noncontrast CT findings of the chest, abdomen, or pelvis to explain sepsis, urinary symptoms, or weight loss. 2. Status post sigmoid colon resection. 3. Status post cholecystectomy and hysterectomy. 4. Coronary artery disease. Aortic Atherosclerosis (ICD10-I70.0). Electronically Signed   By: Delanna Ahmadi M.D.   On: 08/02/2022 15:20   DG Chest Port 1 View  Result Date: 08/02/2022 CLINICAL DATA:  Provided history: Questionable sepsis-evaluate for abnormality. Additional history provided: Burning  with urination, urinary incontinence, confusion. EXAM: PORTABLE CHEST 1 VIEW COMPARISON:  Prior chest radiographs 11/29/2021 and earlier. CT angiogram chest 11/29/2021. FINDINGS: Heart size within normal limits. Aortic atherosclerosis. Small ill-defined opacity within the left lung base. No appreciable airspace consolidation on the right. No evidence of pleural effusion or pneumothorax. Degenerative changes of the spine. IMPRESSION: Small ill-defined opacity within the left lung base, which may reflect atelectasis or early pneumonia. Correlate clinically and consider short-interval radiographic follow-up. Aortic Atherosclerosis (ICD10-I70.0). Electronically Signed   By: Kellie Simmering D.O.   On: 08/02/2022 12:18    Procedures Procedures    Medications Ordered in ED Medications  lactated ringers bolus 1,000 mL (1,000 mLs Intravenous New Bag/Given 08/02/22 1215)    ED Course/ Medical Decision Making/ A&P Clinical Course as of 08/02/22 1620  Mon Aug 02, 2022  1512 Failure to thrive, pending CT and UA [MK]    Clinical Course User Index [MK] Teressa Lower, MD                           Medical Decision Making Amount and/or Complexity of Data  Reviewed Labs: ordered. Radiology: ordered. ECG/medicine tests: ordered.   This patient presents to the ED for concern of generalized weakness and dysuria, this involves an extensive number of treatment options, and is a complaint that carries with it a high risk of complications and morbidity.  The differential diagnosis includes UTI, dehydration, malnutrition, metabolic derangements, deconditioning, other source of infection, anemia   Co morbidities that complicate the patient evaluation  CVA, anxiety, arthritis, colon cancer, DM, nephrolithiasis, HTN, HLD   Additional history obtained:  Additional history obtained from patient's sister-in-law External records from outside source obtained and reviewed including EMR   Lab Tests:  I Ordered, and personally interpreted labs.  The pertinent results include: Normal kidney function, normal electrolytes, mildly elevated blood glucose, baseline anemia, no leukocytosis.  Urine studies pending at time of signout.   Imaging Studies ordered:  I ordered imaging studies including chest x-ray, CT head, chest, abdomen, pelvis I independently visualized and interpreted imaging which showed no acute findings I agree with the radiologist interpretation   Cardiac Monitoring: / EKG:  The patient was maintained on a cardiac monitor.  I personally viewed and interpreted the cardiac monitored which showed an underlying rhythm of: Sinus rhythm  Problem List / ED Course / Critical interventions / Medication management  Patient is a 66 year old female who presents for ongoing dysuria and overall failure to thrive.  She has been bedbound since her prior stroke.  She is currently living with her son.  Her son, who works throughout the day, is not able to care for her during the day.  Patient's sister-in-law checked on her and was concerned.  Although there was initial report of hypotension, this has resolved on arrival in the ED.  Patient does take blood  pressure medications in the morning, including this morning.  Hypotension may be secondary to polypharmacy.  She was given IV fluids and was able to maintain normal blood pressures throughout her ED observation.  Given her hypotension, sepsis workup was initiated.  She does report ongoing dysuria over the past several months.  Patient serum lab work is reassuring.  Urine studies were still pending at time of signout.  Care of patient was signed out to oncoming ED provider. I ordered medication including IV fluids for decreased p.o. intake and concern of hypotension Reevaluation of the patient after these  medicines showed that the patient improved I have reviewed the patients home medicines and have made adjustments as needed   Social Determinants of Health:  Bedbound at baseline, lives with son but has no home health support         Final Clinical Impression(s) / ED Diagnoses Final diagnoses:  Dysuria  Generalized weakness    Rx / DC Orders ED Discharge Orders     None         Godfrey Pick, MD 08/02/22 1621

## 2022-08-02 NOTE — ED Notes (Signed)
CSW consulted for SNF placement. CSW spoke with pts son about pts insurance and that she does not have a qualifying at this time. Pts son aware that SNF would be private pay, they are unable to pay for SNF privately. CSW spoke with pts son about [facility] services. He is agreeable to this. CSW spoke to Novice with [facility] who states they recently D/C pt and can accept her back. CSW requested HH PT/OT/aide orders be placed.  CSW spoke with ED Attending who states that pts urine results are pending at this time and she is still being evaluated. CSW explained that if pt gets admitted TOC will continue following. If pt becomes medically ready for D/C HH has been set up as long as [facility] PT/OT/Aide orders get place by MD.

## 2022-08-03 DIAGNOSIS — N3001 Acute cystitis with hematuria: Secondary | ICD-10-CM | POA: Diagnosis not present

## 2022-08-03 DIAGNOSIS — R300 Dysuria: Secondary | ICD-10-CM | POA: Diagnosis not present

## 2022-08-03 DIAGNOSIS — I10 Essential (primary) hypertension: Secondary | ICD-10-CM

## 2022-08-03 LAB — BASIC METABOLIC PANEL
Anion gap: 11 (ref 5–15)
BUN: 19 mg/dL (ref 8–23)
CO2: 24 mmol/L (ref 22–32)
Calcium: 9.1 mg/dL (ref 8.9–10.3)
Chloride: 105 mmol/L (ref 98–111)
Creatinine, Ser: 0.61 mg/dL (ref 0.44–1.00)
GFR, Estimated: >60 mL/min (ref >60)
Glucose, Bld: 65 mg/dL — ABNORMAL LOW (ref 70–99)
Potassium: 4 mmol/L (ref 3.5–5.1)
Sodium: 140 mmol/L (ref 135–145)

## 2022-08-03 LAB — GLUCOSE, CAPILLARY
Glucose-Capillary: 79 mg/dL (ref 70–99)
Glucose-Capillary: 145 mg/dL — ABNORMAL HIGH (ref 70–99)
Glucose-Capillary: 90 mg/dL (ref 70–99)
Glucose-Capillary: 124 mg/dL — ABNORMAL HIGH (ref 70–99)

## 2022-08-03 LAB — CBC
HCT: 38.8 % (ref 36.0–46.0)
Hemoglobin: 12.4 g/dL (ref 12.0–15.0)
MCH: 28.9 pg (ref 26.0–34.0)
MCHC: 32 g/dL (ref 30.0–36.0)
MCV: 90.4 fL (ref 80.0–100.0)
Platelets: 278 10*3/uL (ref 150–400)
RBC: 4.29 MIL/uL (ref 3.87–5.11)
RDW: 13.7 % (ref 11.5–15.5)
WBC: 7.7 10*3/uL (ref 4.0–10.5)
nRBC: 0 % (ref 0.0–0.2)

## 2022-08-03 LAB — HIV ANTIBODY (ROUTINE TESTING W REFLEX): HIV Screen 4th Generation wRfx: NONREACTIVE

## 2022-08-03 NOTE — Progress Notes (Signed)
PROGRESS NOTE     Candice Mccann, is a 66 y.o. female, DOB - 01-28-1956, UVO:[id number]  Admit date - 08/02/2022   Admitting Physician Arrayah Connors Denton Brick, MD  Outpatient Primary MD for the patient is Luciano Cutter, DO  LOS - 1  Chief Complaint  Patient presents with   Dysuria   Hypotension        Brief Narrative:  66 y.o. female with medical history significant of failure to thrive, dysuria, anxiety, and arthritis, colon cancer, diabetes type 2 with neuropathy, nephrolithiasis, hypertension, hyperlipidemia inability to get out of bed admitted on 08/02/2022 with generalized weakness and concerns about UTI    -Assessment and Plan: 1.  Acute cystitis with hematuria: Continue IV Rocephin pending Blood and urine culture data.   2.  Type 2 diabetes mellitus with diabetic neuropathy: -Check A1c --Had episode of hypoglycemia, she will hold glipizide' Use Novolog/Humalog Sliding scale insulin with Accu-Cheks/Fingersticks as ordered   3.  Dependent for most ADLs/generalized weakness and failure to thrive-- -: Patient unable to care for herself.  Lack of care at home is unfortunately probably contributing multiple urinary tract infections with hospitalizations.  -Get TOC and physical therapy consult - 4)HTN--stop lisinopril due to soft blood pressure, continue metoprolol  Disposition: The patient is from: Home              Anticipated d/c is to: Home with [facility]              Anticipated d/c date is: 1 day              Patient currently is not medically stable to d/c. Barriers: Not Clinically Stable-   Code Status :  -  Code Status: Full Code   Family Communication:    NA (patient is alert, awake and coherent)  DVT Prophylaxis  :   - SCDs  enoxaparin (LOVENOX) injection 40 mg Start: 08/02/22 2200   Lab Results  Component Value Date   PLT 278 08/03/2022    Inpatient Medications  Scheduled Meds:  ALPRAZolam  1 mg Oral QHS   atorvastatin  40 mg Oral Daily   cyanocobalamin  1,000 mcg  Oral Daily   enoxaparin (LOVENOX) injection  40 mg Subcutaneous Q24H   glipiZIDE  2.5 mg Oral BID AC   insulin aspart  0-15 Units Subcutaneous TID WC   lisinopril  40 mg Oral Daily   metoprolol succinate  50 mg Oral Daily   sodium chloride flush  3 mL Intravenous Q12H   Continuous Infusions:  sodium chloride     cefTRIAXone (ROCEPHIN)  IV 1 g (08/03/22 0607)   PRN Meds:.sodium chloride, HYDROcodone-acetaminophen, ondansetron **OR** ondansetron (ZOFRAN) IV, oxyCODONE, polyethylene glycol, sodium chloride flush, traMADol, zolpidem   Anti-infectives (From admission, onward)    Start     Dose/Rate Route Frequency Ordered Stop   08/03/22 0600  cefTRIAXone (ROCEPHIN) 1 g in sodium chloride 0.9 % 100 mL IVPB       Note to Pharmacy: Please schedule based on ED dose times   1 g 200 mL/hr over 30 Minutes Intravenous Every 24 hours 08/02/22 1826     08/02/22 1745  cefTRIAXone (ROCEPHIN) 2 g in sodium chloride 0.9 % 100 mL IVPB        2 g 200 mL/hr over 30 Minutes Intravenous  Once 08/02/22 1735 08/02/22 1901         Subjective: Ansel Bong today has no fevers, no emesis,  No chest pain,  - Eating comfortably, no new  concerns -Oral intake is fair   Objective: Vitals:   08/02/22 2359 08/03/22 0513 08/03/22 0816 08/03/22 1358  BP: (!) 155/99 125/70 (!) 162/101 (!) 141/72  Pulse: 99 87 95 84  Resp: '18 18  17  '$ Temp: 98.2 F (36.8 C) 98 F (36.7 C)  98.1 F (36.7 C)  TempSrc: Oral Oral  Oral  SpO2: 100% 100%  99%  Weight: 57.1 kg 52.2 kg    Height: '5\' 2"'$  (1.575 m)       Intake/Output Summary (Last 24 hours) at 08/03/2022 1922 Last data filed at 08/03/2022 0513 Gross per 24 hour  Intake --  Output 900 ml  Net -900 ml   Filed Weights   08/02/22 1130 08/02/22 2359 08/03/22 0513  Weight: 49.9 kg 57.1 kg 52.2 kg    Physical Exam  Gen:- Awake Alert,  in no apparent distress  HEENT:- [place].AT, No sclera icterus Neck-Supple Neck,No JVD,.  Lungs-  CTAB , fair symmetrical air  movement CV- S1, S2 normal, regular  Abd-  +ve B.Sounds, Abd Soft, No tenderness,    Extremity/Skin:- No  edema, pedal pulses present  Psych-affect is appropriate, oriented x3 Neuro-no new focal deficits, no tremors  Data Reviewed: I have personally reviewed following labs and imaging studies  CBC: Recent Labs  Lab 08/02/22 1152 08/02/22 1222 08/03/22 0508  WBC 7.3  --  7.7  NEUTROABS 4.5  --   --   HGB 10.7* 11.9* 12.4  HCT 34.6* 35.0* 38.8  MCV 92.0  --  90.4  PLT 295  --  062   Basic Metabolic Panel: Recent Labs  Lab 08/02/22 1152 08/02/22 1222 08/03/22 0508  NA 138 141 140  K 4.3 4.3 4.0  CL 105 106 105  CO2 25  --  24  GLUCOSE 172* 166* 65*  BUN '22 20 19  '$ CREATININE 0.90 1.00 0.61  CALCIUM 9.0  --  9.1  MG 1.8  --   --    GFR: Estimated Creatinine Clearance: 54 mL/min (by C-G formula based on SCr of 0.61 mg/dL). Liver Function Tests: Recent Labs  Lab 08/02/22 1152  AST 15  ALT 11  ALKPHOS 96  BILITOT 1.0  PROT 7.4  ALBUMIN 3.5    ) Recent Results (from the past 240 hour(s))  Blood Culture (routine x 2)     Status: None (Preliminary result)   Collection Time: 08/02/22 11:52 AM   Specimen: BLOOD  Result Value Ref Range Status   Specimen Description BLOOD BLOOD RIGHT FOREARM  Final   Special Requests   Final    BOTTLES DRAWN AEROBIC AND ANAEROBIC Blood Culture adequate volume   Culture   Final    NO GROWTH < 24 HOURS Performed at [facility], [address], [place], [place] [address]    Report Status PENDING  Incomplete  Blood Culture (routine x 2)     Status: None (Preliminary result)   Collection Time: 08/02/22  1:40 PM   Specimen: BLOOD  Result Value Ref Range Status   Specimen Description BLOOD BLOOD RIGHT HAND  Final   Special Requests   Final    BOTTLES DRAWN AEROBIC ONLY Blood Culture adequate volume   Culture   Final    NO GROWTH < 24 HOURS Performed at [facility], [address] [place], [place] [address]    Report Status  PENDING  Incomplete      Radiology Studies: CT Head Wo Contrast  Result Date: 08/02/2022 CLINICAL DATA:  Mental status changes  of unknown cause, burning with urination for 3 months, incontinent, LEFT groin pain, anorexia and weight loss over past year EXAM: CT HEAD WITHOUT CONTRAST TECHNIQUE: Contiguous axial images were obtained from the base of the skull through the vertex without intravenous contrast. RADIATION DOSE REDUCTION: This exam was performed according to the departmental dose-optimization program which includes automated exposure control, adjustment of the mA and/or kV according to patient size and/or use of iterative reconstruction technique. COMPARISON:  None Available. FINDINGS: Brain: Generalized atrophy. Normal ventricular morphology. No midline shift or mass effect. Small vessel chronic ischemic changes of deep cerebral white matter. No intracranial hemorrhage, mass lesion, evidence of acute infarction, or extra-axial fluid collection. Vascular: No hyperdense vessels. Atherosclerotic calcifications of internal carotid arteries at skull base Skull: Intact Sinuses/Orbits: Clear Other: N/A IMPRESSION: Atrophy with small vessel chronic ischemic changes of deep cerebral white matter. No acute intracranial abnormalities. Electronically Signed   By: Lavonia Dana M.D.   On: 08/02/2022 15:24   CT CHEST ABDOMEN PELVIS WO CONTRAST  Result Date: 08/02/2022 CLINICAL DATA:  Sepsis, burning with urination for 3 months, incontinence, left groin pain, loss of appetite and weight loss EXAM: CT CHEST, ABDOMEN AND PELVIS WITHOUT CONTRAST TECHNIQUE: Multidetector CT imaging of the chest, abdomen and pelvis was performed following the standard protocol without IV contrast. RADIATION DOSE REDUCTION: This exam was performed according to the departmental dose-optimization program which includes automated exposure control, adjustment of the mA and/or kV according to patient size and/or use of iterative  reconstruction technique. COMPARISON:  CT abdomen pelvis, 07/02/2022, CT chest, 11/30/2021 FINDINGS: CT CHEST FINDINGS Cardiovascular: Aortic atherosclerosis. Normal heart size. Three-vessel coronary artery calcifications. No pericardial effusion. Mediastinum/Nodes: No enlarged mediastinal, hilar, or axillary lymph nodes. Thyroid gland, trachea, and esophagus demonstrate no significant findings. Lungs/Pleura: Mild, bland appearing scarring of the bilateral lung bases. No pleural effusion or pneumothorax. Musculoskeletal: No chest wall abnormality. Unchanged, mild wedge deformity of the T8 vertebral body (series 7, image 81) CT ABDOMEN PELVIS FINDINGS Hepatobiliary: No focal liver abnormality is seen. Status post cholecystectomy. No biliary dilatation. Pancreas: Unremarkable. No pancreatic ductal dilatation or surrounding inflammatory changes. Spleen: Normal in size without significant abnormality. Adrenals/Urinary Tract: Adrenal glands are unremarkable. Kidneys are normal, without renal calculi, solid lesion, or hydronephrosis. Bladder is unremarkable. Stomach/Bowel: Stomach is within normal limits. Appendix is not clearly visualized. No evidence of bowel wall thickening, distention, or inflammatory changes. Status post sigmoid colon resection and reanastomosis. Vascular/Lymphatic: Aortic atherosclerosis. No enlarged abdominal or pelvic lymph nodes. Reproductive: Status post hysterectomy. Other: No abdominal wall hernia or abnormality. No ascites. Musculoskeletal: No acute osseous findings. IMPRESSION: 1. No acute noncontrast CT findings of the chest, abdomen, or pelvis to explain sepsis, urinary symptoms, or weight loss. 2. Status post sigmoid colon resection. 3. Status post cholecystectomy and hysterectomy. 4. Coronary artery disease. Aortic Atherosclerosis (ICD10-I70.0). Electronically Signed   By: Delanna Ahmadi M.D.   On: 08/02/2022 15:20   DG Chest Port 1 View  Result Date: 08/02/2022 CLINICAL DATA:   Provided history: Questionable sepsis-evaluate for abnormality. Additional history provided: Burning with urination, urinary incontinence, confusion. EXAM: PORTABLE CHEST 1 VIEW COMPARISON:  Prior chest radiographs 11/29/2021 and earlier. CT angiogram chest 11/29/2021. FINDINGS: Heart size within normal limits. Aortic atherosclerosis. Small ill-defined opacity within the left lung base. No appreciable airspace consolidation on the right. No evidence of pleural effusion or pneumothorax. Degenerative changes of the spine. IMPRESSION: Small ill-defined opacity within the left lung base, which may reflect atelectasis or early pneumonia. Correlate  clinically and consider short-interval radiographic follow-up. Aortic Atherosclerosis (ICD10-I70.0). Electronically Signed   By: Kellie Simmering D.O.   On: 08/02/2022 12:18     Scheduled Meds:  ALPRAZolam  1 mg Oral QHS   atorvastatin  40 mg Oral Daily   cyanocobalamin  1,000 mcg Oral Daily   enoxaparin (LOVENOX) injection  40 mg Subcutaneous Q24H   glipiZIDE  2.5 mg Oral BID AC   insulin aspart  0-15 Units Subcutaneous TID WC   lisinopril  40 mg Oral Daily   metoprolol succinate  50 mg Oral Daily   sodium chloride flush  3 mL Intravenous Q12H   Continuous Infusions:  sodium chloride     cefTRIAXone (ROCEPHIN)  IV 1 g (08/03/22 2671)     LOS: 1 day    Roxan Hockey M.D on 08/03/2022 at 7:22 PM  Go to [email or web address] - for contact info  [facility] - Office  [phone]  If 7PM-7AM, please contact night-coverage [email or web address] 08/03/2022, 7:22 PM

## 2022-08-03 NOTE — Inpatient Diabetes Management (Signed)
Inpatient Diabetes Program Recommendations  AACE/ADA: New Consensus Statement on Inpatient Glycemic Control (2015)  Target Ranges:  Prepandial:   less than 140 mg/dL      Peak postprandial:   less than 180 mg/dL (1-2 hours)      Critically ill patients:  140 - 180 mg/dL   Lab Results  Component Value Date   GLUCAP 79 08/03/2022   HGBA1C 9.5 (H) 02/05/2018    Review of Glycemic Control  Latest Reference Range & Units 08/03/22 07:56  Glucose-Capillary 70 - 99 mg/dL 79   Diabetes history: Type 2 DM Outpatient Diabetes medications: Glipizide 2.5 mg BID Current orders for Inpatient glycemic control: Glipizide 2.5 mg BID, Novolog 0-15 units TID  A1C in process  Inpatient Diabetes Program Recommendations:    FSBG this AM 79 mg/dL. Would continue discontinuing Glipizide while inpatient.  Thanks, Bronson Curb, MSN, RNC-OB Diabetes Coordinator [phone] (8a-5p)

## 2022-08-04 DIAGNOSIS — R262 Difficulty in walking, not elsewhere classified: Secondary | ICD-10-CM

## 2022-08-04 DIAGNOSIS — E1142 Type 2 diabetes mellitus with diabetic polyneuropathy: Secondary | ICD-10-CM

## 2022-08-04 DIAGNOSIS — Z741 Need for assistance with personal care: Secondary | ICD-10-CM

## 2022-08-04 DIAGNOSIS — N3001 Acute cystitis with hematuria: Secondary | ICD-10-CM | POA: Diagnosis not present

## 2022-08-04 LAB — CBC
HCT: 37.7 % (ref 36.0–46.0)
Hemoglobin: 11.7 g/dL — ABNORMAL LOW (ref 12.0–15.0)
MCH: 28.3 pg (ref 26.0–34.0)
MCHC: 31 g/dL (ref 30.0–36.0)
MCV: 91.3 fL (ref 80.0–100.0)
Platelets: 251 10*3/uL (ref 150–400)
RBC: 4.13 MIL/uL (ref 3.87–5.11)
RDW: 13.5 % (ref 11.5–15.5)
WBC: 6.1 10*3/uL (ref 4.0–10.5)
nRBC: 0 % (ref 0.0–0.2)

## 2022-08-04 LAB — BASIC METABOLIC PANEL
Anion gap: 8 (ref 5–15)
BUN: 21 mg/dL (ref 8–23)
CO2: 25 mmol/L (ref 22–32)
Calcium: 8.7 mg/dL — ABNORMAL LOW (ref 8.9–10.3)
Chloride: 104 mmol/L (ref 98–111)
Creatinine, Ser: 0.81 mg/dL (ref 0.44–1.00)
GFR, Estimated: >60 mL/min (ref >60)
Glucose, Bld: 72 mg/dL (ref 70–99)
Potassium: 4.2 mmol/L (ref 3.5–5.1)
Sodium: 137 mmol/L (ref 135–145)

## 2022-08-04 LAB — GLUCOSE, CAPILLARY
Glucose-Capillary: 65 mg/dL — ABNORMAL LOW (ref 70–99)
Glucose-Capillary: 68 mg/dL — ABNORMAL LOW (ref 70–99)
Glucose-Capillary: 95 mg/dL (ref 70–99)
Glucose-Capillary: 175 mg/dL — ABNORMAL HIGH (ref 70–99)

## 2022-08-04 LAB — HEMOGLOBIN A1C
Hgb A1c MFr Bld: 6.7 % — ABNORMAL HIGH (ref 4.8–5.6)
Mean Plasma Glucose: 146 mg/dL

## 2022-08-04 MED ORDER — CEFDINIR 300 MG PO CAPS: 300.0000 mg | ORAL_CAPSULE | Freq: Every day | ORAL | 0 refills | Status: AC

## 2022-08-04 NOTE — Care Management CC44 (Signed)
Condition Code 44 Documentation Completed  Patient Details  Name: Candice Mccann MRN: [id number] Date of Birth: 1956-01-16   Condition Code 44 given:  Yes Patient signature on Condition Code 44 notice:  Yes Documentation of 2 MD's agreement:  Yes Code 44 added to claim:  Yes    Salome Arnt, [place] 08/04/2022, 9:25 AM

## 2022-08-04 NOTE — TOC Progression Note (Signed)
Transition of Care [facility]) - Progression Note    Patient Details  Name: Candice Mccann MRN: [id number] Date of Birth: 03-05-1956  [facility]) CM/SW Contact  Salome Arnt, [place] Phone Number: 08/04/2022, 9:27 AM  Clinical Narrative:  LCSW spoke with pt's son about d/c plan as pt is now observation. He is aware pt will not have qualifying stay at this point for SNF. Pt's son states he is working on having additional care in the home for patient during the day. He is agreeable to home health which has been arranged. TOC will continue to follow.      Expected Discharge Plan: [place] Barriers to Discharge: Continued Medical Work up  Expected Discharge Plan and Services Expected Discharge Plan: [place]                                               Social Determinants of Health (SDOH) Interventions    Readmission Risk Interventions     No data to display

## 2022-08-04 NOTE — Discharge Instructions (Signed)
IMPORTANT INFORMATION: PAY CLOSE ATTENTION   PHYSICIAN DISCHARGE INSTRUCTIONS  Follow with Primary care provider  Luciano Cutter, DO  and other consultants as instructed by your Hospitalist Physician  [place] IF SYMPTOMS COME BACK, WORSEN OR NEW PROBLEM DEVELOPS   Please note: You were cared for by a hospitalist during your hospital stay. Every effort will be made to forward records to your primary care provider.  You can request that your primary care provider send for your hospital records if they have not received them.  Once you are discharged, your primary care physician will handle any further medical issues. Please note that NO REFILLS for any discharge medications will be authorized once you are discharged, as it is imperative that you return to your primary care physician (or establish a relationship with a primary care physician if you do not have one) for your post hospital discharge needs so that they can reassess your need for medications and monitor your lab values.  Please get a complete blood count and chemistry panel checked by your Primary MD at your next visit, and again as instructed by your Primary MD.  Get Medicines reviewed and adjusted: Please take all your medications with you for your next visit with your Primary MD  Laboratory/radiological data: Please request your Primary MD to go over all hospital tests and procedure/radiological results at the follow up, please ask your primary care provider to get all Hospital records sent to his/her office.  In some cases, they will be blood work, cultures and biopsy results pending at the time of your discharge. Please request that your primary care provider follow up on these results.  If you are diabetic, please bring your blood sugar readings with you to your follow up appointment with primary care.    Please call and make your follow up appointments as soon as possible.    Also Note  the following: If you experience worsening of your admission symptoms, develop shortness of breath, life threatening emergency, suicidal or homicidal thoughts you must seek medical attention immediately by calling 911 or calling your MD immediately  if symptoms less severe.  You must read complete instructions/literature along with all the possible adverse reactions/side effects for all the Medicines you take and that have been prescribed to you. Take any new Medicines after you have completely understood and accpet all the possible adverse reactions/side effects.   Do not drive when taking Pain medications or sleeping medications (Benzodiazepines)  Do not take more than prescribed Pain, Sleep and Anxiety Medications. It is not advisable to combine anxiety,sleep and pain medications without talking with your primary care practitioner  Special Instructions: If you have smoked or chewed Tobacco  in the last 2 yrs please stop smoking, stop any regular Alcohol  and or any Recreational drug use.  Wear Seat belts while driving.  Do not drive if taking any narcotic, mind altering or controlled substances or recreational drugs or alcohol.

## 2022-08-04 NOTE — Care Management Obs Status (Signed)
[place] NOTIFICATION   Patient Details  Name: Candice Mccann MRN: [id number] Date of Birth: June 09, 1956   Medicare Observation Status Notification Given:  Yes    Salome Arnt, LCSW 08/04/2022, 9:25 AM

## 2022-08-04 NOTE — Progress Notes (Signed)
[place] Discharge Note  Admit Date:  08/02/2022 Discharge date: 08/04/2022   Wyvonna Plum to be D/C'd Home per MD order.  AVS completed. Patient/caregiver able to verbalize understanding.  Discharge Medication: Allergies as of 08/04/2022       Reactions   Contrast Media [iodinated Contrast Media] Anaphylaxis, Nausea And Vomiting   Nitroglycerin Other (See Comments)   Cardiac arrest   Tomato Itching   Sulfa Antibiotics Rash        Medication List     STOP taking these medications    glipiZIDE 5 MG tablet Commonly known as: GLUCOTROL   lisinopril 40 MG tablet Commonly known as: ZESTRIL       TAKE these medications    ALPRAZolam 1 MG tablet Commonly known as: XANAX Take 1 mg by mouth at bedtime.   atorvastatin 40 MG tablet Commonly known as: LIPITOR Take 40 mg by mouth daily.   cefdinir 300 MG capsule Commonly known as: OMNICEF Take 1 capsule (300 mg total) by mouth daily for 4 days. Start taking on: August 05, 2022   cyanocobalamin 1000 MCG tablet Commonly known as: VITAMIN B12 Take 1,000 mcg by mouth daily.   HYDROcodone-acetaminophen 7.5-325 MG tablet Commonly known as: NORCO Take 1 tablet by mouth every 6 (six) hours as needed for severe pain.   metoprolol succinate 50 MG 24 hr tablet Commonly known as: TOPROL-XL Take 1 tablet by mouth daily.   polyethylene glycol 17 g packet Commonly known as: MIRALAX / GLYCOLAX Take 17 g by mouth daily as needed for moderate constipation.        Discharge Assessment: Vitals:   08/03/22 2113 08/04/22 0416  BP: (!) 100/57 94/62  Pulse: 72 73  Resp: 19 18  Temp: 98 F (36.7 C) 97.6 F (36.4 C)  SpO2: 100% 100%   Skin clean, dry and intact without evidence of skin break down, no evidence of skin tears noted. IV catheter discontinued intact. Site without signs and symptoms of complications - no redness or edema noted at insertion site, patient denies c/o pain - only slight tenderness at site.  Dressing with  slight pressure applied.  D/c Instructions-Education: Discharge instructions given to patient/family with verbalized understanding. D/c education completed with patient/family including follow up instructions, medication list, d/c activities limitations if indicated, with other d/c instructions as indicated by MD - patient able to verbalize understanding, all questions fully answered. Patient instructed to return to ED, call 911, or call MD for any changes in condition.  Patient escorted via [place], and D/C home via private auto.  Tsosie Billing, LPN 37/11/8268 78:67 PM

## 2022-08-04 NOTE — TOC Transition Note (Signed)
Transition of Care [facility]) - CM/SW Discharge Note   Patient Details  Name: Candice Mccann MRN: [id number] Date of Birth: [birth year]  [facility]) CM/SW Contact:  Salome Arnt, LCSW Phone Number: 08/04/2022, 11:00 AM   Clinical Narrative:  Pt d/c today. Linda with [facility] notified of d/c. Home health orders in. Pt's son will pick up pt this afternoon. RN updated.      Final next level of care: [place] Barriers to Discharge: Barriers Resolved   Patient Goals and CMS Choice        Discharge Placement                  Name of family member notified: Herbie [place]- son Patient and family notified of of transfer: 08/04/22  Discharge Plan and Services                          HH Arranged: PT, OT, Nurse's Aide [place]: [place] ([place]athalie) Date [place]: 08/04/22 Time [place]: 1059 Representative spoke with at [place]: [place] Determinants of Health ([place]) Interventions     Readmission Risk Interventions     No data to display

## 2022-08-04 NOTE — Discharge Summary (Signed)
Physician Discharge Summary  Candice Mccann MVE:[id number] DOB: 1955/09/03 DOA: 08/02/2022  PCP: Luciano Cutter, DO  Admit date: 08/02/2022 Discharge date: 08/04/2022  Admitted From:  Home  Disposition: Home with [place] (Declines SNF)  Recommendations for Outpatient Follow-up:  Follow up with PCP in 1 weeks Please monitor blood sugars at least twice per day  Please monitor blood pressures  Please follow up final urine culture results (pending at time of discharge)   Home Health: PT/OT/Aide   Discharge Condition: STABLE   CODE STATUS: FULL DIET: Resume prior home diet    Brief Hospitalization Summary: Please see all hospital notes, images, labs for full details of the hospitalization. Admission HPI:  66 y.o. female with medical history significant of failure to thrive, dysuria, anxiety, and arthritis, colon cancer, diabetes type 2 with neuropathy, nephrolithiasis, hypertension, hyperlipidemia inability to get out of bed admitted on 08/02/2022 with generalized weakness and concerns about UTI.    Hospital Course  Pt was admitted with acute cystitis UTI with hematuria and treated with IV ceftriaxone and IV fluid.  She responded favorably to treatments and feeling better.  Pt adamantly declines SNF although this was strongly advised given her chronically debilitated state but she declines but is willing to accept home health and reports she has assistance at home from family members for her ADLs and care.  She was taken off of glipizide due to hypoglycemia noticed in hospital and taken off lisinopril due to soft BPs.   Home health has been ordered.  Pt discharging home in stable condition.    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 ([place])   Diabetes mellitus type 2 in obese ([place])   Difficulty walking   History of stroke   Dependent for bowel elimination   Dependent for transferring  location   Dependent for dressing   Dependent for meal preparation   Dependent for medication administration   UTI (urinary tract infection)   Discharge Instructions:  Allergies as of 08/04/2022       Reactions   Contrast Media [iodinated Contrast Media] Anaphylaxis, Nausea And Vomiting   Nitroglycerin Other (See Comments)   Cardiac arrest   Tomato Itching   Sulfa Antibiotics Rash        Medication List     STOP taking these medications    glipiZIDE 5 MG tablet Commonly known as: GLUCOTROL   lisinopril 40 MG tablet Commonly known as: ZESTRIL       TAKE these medications    ALPRAZolam 1 MG tablet Commonly known as: XANAX Take 1 mg by mouth at bedtime.   atorvastatin 40 MG tablet Commonly known as: LIPITOR Take 40 mg by mouth daily.   cefdinir 300 MG capsule Commonly known as: OMNICEF Take 1 capsule (300 mg total) by mouth daily for 4 days. Start taking on: August 05, 2022   cyanocobalamin 1000 MCG tablet Commonly known as: VITAMIN B12 Take 1,000 mcg by mouth daily.   HYDROcodone-acetaminophen 7.5-325 MG tablet Commonly known as: NORCO Take 1 tablet by mouth every 6 (six) hours as needed for severe pain.   metoprolol succinate 50 MG 24 hr tablet Commonly known as: TOPROL-XL Take 1 tablet by mouth daily.   polyethylene glycol 17 g packet Commonly known as: MIRALAX / GLYCOLAX Take 17 g by mouth daily as needed for moderate constipation.        Follow-up Information  Luciano Cutter, DO. Schedule an appointment as soon as possible for a visit in 1 week(s).   Specialty: Family Medicine Why: Hospital Follow Up Contact information: [place] [address] [phone]                Allergies  Allergen Reactions   Contrast Media [Iodinated Contrast Media] Anaphylaxis and Nausea And Vomiting   Nitroglycerin Other (See Comments)    Cardiac arrest   Tomato Itching   Sulfa Antibiotics Rash   Allergies as of 08/04/2022        Reactions   Contrast Media [iodinated Contrast Media] Anaphylaxis, Nausea And Vomiting   Nitroglycerin Other (See Comments)   Cardiac arrest   Tomato Itching   Sulfa Antibiotics Rash        Medication List     STOP taking these medications    glipiZIDE 5 MG tablet Commonly known as: GLUCOTROL   lisinopril 40 MG tablet Commonly known as: ZESTRIL       TAKE these medications    ALPRAZolam 1 MG tablet Commonly known as: XANAX Take 1 mg by mouth at bedtime.   atorvastatin 40 MG tablet Commonly known as: LIPITOR Take 40 mg by mouth daily.   cefdinir 300 MG capsule Commonly known as: OMNICEF Take 1 capsule (300 mg total) by mouth daily for 4 days. Start taking on: August 05, 2022   cyanocobalamin 1000 MCG tablet Commonly known as: VITAMIN B12 Take 1,000 mcg by mouth daily.   HYDROcodone-acetaminophen 7.5-325 MG tablet Commonly known as: NORCO Take 1 tablet by mouth every 6 (six) hours as needed for severe pain.   metoprolol succinate 50 MG 24 hr tablet Commonly known as: TOPROL-XL Take 1 tablet by mouth daily.   polyethylene glycol 17 g packet Commonly known as: MIRALAX / GLYCOLAX Take 17 g by mouth daily as needed for moderate constipation.        Procedures/Studies: CT Head Wo Contrast  Result Date: 08/02/2022 CLINICAL DATA:  Mental status changes of unknown cause, burning with urination for 3 months, incontinent, LEFT groin pain, anorexia and weight loss over past year EXAM: CT HEAD WITHOUT CONTRAST TECHNIQUE: Contiguous axial images were obtained from the base of the skull through the vertex without intravenous contrast. RADIATION DOSE REDUCTION: This exam was performed according to the departmental dose-optimization program which includes automated exposure control, adjustment of the mA and/or kV according to patient size and/or use of iterative reconstruction technique. COMPARISON:  None Available. FINDINGS: Brain: Generalized atrophy. Normal ventricular  morphology. No midline shift or mass effect. Small vessel chronic ischemic changes of deep cerebral white matter. No intracranial hemorrhage, mass lesion, evidence of acute infarction, or extra-axial fluid collection. Vascular: No hyperdense vessels. Atherosclerotic calcifications of internal carotid arteries at skull base Skull: Intact Sinuses/Orbits: Clear Other: N/A IMPRESSION: Atrophy with small vessel chronic ischemic changes of deep cerebral white matter. No acute intracranial abnormalities. Electronically Signed   By: Lavonia Dana M.D.   On: 08/02/2022 15:24   CT CHEST ABDOMEN PELVIS WO CONTRAST  Result Date: 08/02/2022 CLINICAL DATA:  Sepsis, burning with urination for 3 months, incontinence, left groin pain, loss of appetite and weight loss EXAM: CT CHEST, ABDOMEN AND PELVIS WITHOUT CONTRAST TECHNIQUE: Multidetector CT imaging of the chest, abdomen and pelvis was performed following the standard protocol without IV contrast. RADIATION DOSE REDUCTION: This exam was performed according to the departmental dose-optimization program which includes automated exposure control, adjustment of the mA and/or kV according to patient size  and/or use of iterative reconstruction technique. COMPARISON:  CT abdomen pelvis, 07/02/2022, CT chest, 11/30/2021 FINDINGS: CT CHEST FINDINGS Cardiovascular: Aortic atherosclerosis. Normal heart size. Three-vessel coronary artery calcifications. No pericardial effusion. Mediastinum/Nodes: No enlarged mediastinal, hilar, or axillary lymph nodes. Thyroid gland, trachea, and esophagus demonstrate no significant findings. Lungs/Pleura: Mild, bland appearing scarring of the bilateral lung bases. No pleural effusion or pneumothorax. Musculoskeletal: No chest wall abnormality. Unchanged, mild wedge deformity of the T8 vertebral body (series 7, image 81) CT ABDOMEN PELVIS FINDINGS Hepatobiliary: No focal liver abnormality is seen. Status post cholecystectomy. No biliary dilatation.  Pancreas: Unremarkable. No pancreatic ductal dilatation or surrounding inflammatory changes. Spleen: Normal in size without significant abnormality. Adrenals/Urinary Tract: Adrenal glands are unremarkable. Kidneys are normal, without renal calculi, solid lesion, or hydronephrosis. Bladder is unremarkable. Stomach/Bowel: Stomach is within normal limits. Appendix is not clearly visualized. No evidence of bowel wall thickening, distention, or inflammatory changes. Status post sigmoid colon resection and reanastomosis. Vascular/Lymphatic: Aortic atherosclerosis. No enlarged abdominal or pelvic lymph nodes. Reproductive: Status post hysterectomy. Other: No abdominal wall hernia or abnormality. No ascites. Musculoskeletal: No acute osseous findings. IMPRESSION: 1. No acute noncontrast CT findings of the chest, abdomen, or pelvis to explain sepsis, urinary symptoms, or weight loss. 2. Status post sigmoid colon resection. 3. Status post cholecystectomy and hysterectomy. 4. Coronary artery disease. Aortic Atherosclerosis (ICD10-I70.0). Electronically Signed   By: Delanna Ahmadi M.D.   On: 08/02/2022 15:20   DG Chest Port 1 View  Result Date: 08/02/2022 CLINICAL DATA:  Provided history: Questionable sepsis-evaluate for abnormality. Additional history provided: Burning with urination, urinary incontinence, confusion. EXAM: PORTABLE CHEST 1 VIEW COMPARISON:  Prior chest radiographs 11/29/2021 and earlier. CT angiogram chest 11/29/2021. FINDINGS: Heart size within normal limits. Aortic atherosclerosis. Small ill-defined opacity within the left lung base. No appreciable airspace consolidation on the right. No evidence of pleural effusion or pneumothorax. Degenerative changes of the spine. IMPRESSION: Small ill-defined opacity within the left lung base, which may reflect atelectasis or early pneumonia. Correlate clinically and consider short-interval radiographic follow-up. Aortic Atherosclerosis (ICD10-I70.0). Electronically  Signed   By: Kellie Simmering D.O.   On: 08/02/2022 12:18     Subjective: Pt says she wants to go home, she is not willing to go to SNF and declines.  She does accept home health services.    Discharge Exam: Vitals:   08/03/22 2113 08/04/22 0416  BP: (!) 100/57 94/62  Pulse: 72 73  Resp: 19 18  Temp: 98 F (36.7 C) 97.6 F (36.4 C)  SpO2: 100% 100%   Vitals:   08/03/22 1358 08/03/22 2113 08/04/22 0416 08/04/22 0500  BP: (!) 141/72 (!) 100/57 94/62   Pulse: 84 72 73   Resp: '17 19 18   '$ Temp: 98.1 F (36.7 C) 98 F (36.7 C) 97.6 F (36.4 C)   TempSrc: Oral Oral    SpO2: 99% 100% 100%   Weight:    52.1 kg  Height:        General: Pt is alert, awake, not in acute distress Cardiovascular: normal S1/S2 +, no rubs, no gallops Respiratory: CTA bilaterally, no wheezing, no rhonchi Abdominal: Soft, NT, ND, bowel sounds + Extremities: no edema, no cyanosis   The results of significant diagnostics from this hospitalization (including imaging, microbiology, ancillary and laboratory) are listed below for reference.     Microbiology: Recent Results (from the past 240 hour(s))  Blood Culture (routine x 2)     Status: None (Preliminary result)   Collection Time:  08/02/22 11:52 AM   Specimen: BLOOD  Result Value Ref Range Status   Specimen Description BLOOD BLOOD RIGHT FOREARM  Final   Special Requests   Final    BOTTLES DRAWN AEROBIC AND ANAEROBIC Blood Culture adequate volume   Culture   Final    NO GROWTH 2 DAYS Performed at [facility], [address] [place], [place] [address]    Report Status PENDING  Incomplete  Blood Culture (routine x 2)     Status: None (Preliminary result)   Collection Time: 08/02/22  1:40 PM   Specimen: BLOOD  Result Value Ref Range Status   Specimen Description BLOOD BLOOD RIGHT HAND  Final   Special Requests   Final    BOTTLES DRAWN AEROBIC ONLY Blood Culture adequate volume   Culture   Final    NO GROWTH 2 DAYS Performed at [facility],  [address] [place], [place] [address]    Report Status PENDING  Incomplete  Urine Culture     Status: Abnormal (Preliminary result)   Collection Time: 08/02/22  3:00 PM   Specimen: Urine, Clean Catch  Result Value Ref Range Status   Specimen Description   Final    URINE, CLEAN CATCH Performed at [facility], [address] [place], [place] [address]    Special Requests   Final    NONE Performed at [facility], [address], [place], [place] [address]    Culture (A)  Final    50,000 COLONIES/mL GRAM NEGATIVE RODS 40,000 COLONIES/mL KLEBSIELLA PNEUMONIAE SUSCEPTIBILITIES TO FOLLOW Performed at [facility], [place] [address], [place], [place] [address]    Report Status PENDING  Incomplete     Labs: BNP (last 3 results) No results for input(s): "BNP" in the last 8760 hours. Basic Metabolic Panel: Recent Labs  Lab 08/02/22 1152 08/02/22 1222 08/03/22 0508 08/04/22 0529  NA 138 141 140 137  K 4.3 4.3 4.0 4.2  CL 105 106 105 104  CO2 25  --  24 25  GLUCOSE 172* 166* 65* 72  BUN '22 20 19 21  '$ CREATININE 0.90 1.00 0.61 0.81  CALCIUM 9.0  --  9.1 8.7*  MG 1.8  --   --   --    Liver Function Tests: Recent Labs  Lab 08/02/22 1152  AST 15  ALT 11  ALKPHOS 96  BILITOT 1.0  PROT 7.4  ALBUMIN 3.5   No results for input(s): "LIPASE", "AMYLASE" in the last 168 hours. No results for input(s): "AMMONIA" in the last 168 hours. CBC: Recent Labs  Lab 08/02/22 1152 08/02/22 1222 08/03/22 0508 08/04/22 0529  WBC 7.3  --  7.7 6.1  NEUTROABS 4.5  --   --   --   HGB 10.7* 11.9* 12.4 11.7*  HCT 34.6* 35.0* 38.8 37.7  MCV 92.0  --  90.4 91.3  PLT 295  --  278 251   Cardiac Enzymes: No results for input(s): "CKTOTAL", "CKMB", "CKMBINDEX", "TROPONINI" in the last 168 hours. BNP: Invalid input(s): "POCBNP" CBG: Recent Labs  Lab 08/03/22 1706 08/03/22 2116 08/04/22 0722 08/04/22 0752 08/04/22 0854  GLUCAP 90 124* 65* 68* 95   D-Dimer No results for input(s):  "DDIMER" in the last 72 hours. Hgb A1c Recent Labs    08/03/22 0508  HGBA1C 6.7*   Lipid Profile No results for input(s): "CHOL", "HDL", "LDLCALC", "TRIG", "CHOLHDL", "LDLDIRECT" in the last 72 hours. Thyroid function studies No results for input(s): "TSH", "T4TOTAL", "T3FREE", "THYROIDAB" in the  last 72 hours.  Invalid input(s): "FREET3" Anemia work up No results for input(s): "VITAMINB12", "FOLATE", "FERRITIN", "TIBC", "IRON", "RETICCTPCT" in the last 72 hours. Urinalysis    Component Value Date/Time   COLORURINE YELLOW 08/02/2022 1500   APPEARANCEUR CLOUDY (A) 08/02/2022 1500   LABSPEC 1.021 08/02/2022 1500   PHURINE 5.0 08/02/2022 1500   GLUCOSEU >=500 (A) 08/02/2022 1500   HGBUR MODERATE (A) 08/02/2022 1500   BILIRUBINUR NEGATIVE 08/02/2022 1500   KETONESUR NEGATIVE 08/02/2022 1500   PROTEINUR 30 (A) 08/02/2022 1500   NITRITE NEGATIVE 08/02/2022 1500   LEUKOCYTESUR LARGE (A) 08/02/2022 1500   Sepsis Labs Recent Labs  Lab 08/02/22 1152 08/03/22 0508 08/04/22 0529  WBC 7.3 7.7 6.1   Microbiology Recent Results (from the past 240 hour(s))  Blood Culture (routine x 2)     Status: None (Preliminary result)   Collection Time: 08/02/22 11:52 AM   Specimen: BLOOD  Result Value Ref Range Status   Specimen Description BLOOD BLOOD RIGHT FOREARM  Final   Special Requests   Final    BOTTLES DRAWN AEROBIC AND ANAEROBIC Blood Culture adequate volume   Culture   Final    NO GROWTH 2 DAYS Performed at [facility], [address], [place], [place] [address]    Report Status PENDING  Incomplete  Blood Culture (routine x 2)     Status: None (Preliminary result)   Collection Time: 08/02/22  1:40 PM   Specimen: BLOOD  Result Value Ref Range Status   Specimen Description BLOOD BLOOD RIGHT HAND  Final   Special Requests   Final    BOTTLES DRAWN AEROBIC ONLY Blood Culture adequate volume   Culture   Final    NO GROWTH 2 DAYS Performed at [facility], [address],  [place], [place] [address]    Report Status PENDING  Incomplete  Urine Culture     Status: Abnormal (Preliminary result)   Collection Time: 08/02/22  3:00 PM   Specimen: Urine, Clean Catch  Result Value Ref Range Status   Specimen Description   Final    URINE, CLEAN CATCH Performed at [facility], [address], [place], [place] [address]    Special Requests   Final    NONE Performed at [facility], [address] [place], [place] [address]    Culture (A)  Final    50,000 COLONIES/mL GRAM NEGATIVE RODS 40,000 COLONIES/mL KLEBSIELLA PNEUMONIAE SUSCEPTIBILITIES TO FOLLOW Performed at [facility], [place] [address] [place], [place] [address]    Report Status PENDING  Incomplete    Time coordinating discharge:   SIGNED:  Irwin Brakeman, MD  [facility] 08/04/2022, 10:49 AM How to contact the [facility] Attending or Consulting provider [place] or covering provider during after hours [place], for this patient?  Check the care team in [facility] and look for a) attending/consulting [facility] provider listed and b) the [facility] team listed Log into [email or web address] and use [place]'s universal password to access. If you do not have the password, please contact the hospital operator. Locate the [facility] provider you are looking for under [facility] and page to a number that you can be directly reached. If you still have difficulty reaching the provider, please page the [facility] (Director on Call) for the Hospitalists listed on amion for assistance.

## 2022-08-05 ENCOUNTER — Telehealth: Payer: Self-pay | Admitting: Family Medicine

## 2022-08-05 LAB — URINE CULTURE: Culture: 50000 — AB

## 2022-08-05 MED ORDER — CIPROFLOXACIN HCL 500 MG PO TABS: 500.0000 mg | ORAL_TABLET | Freq: Two times a day (BID) | ORAL | 0 refills | Status: AC

## 2022-08-05 NOTE — Telephone Encounter (Signed)
Rx called in for ciprofloxacin based on final urine culture results.    Murvin Natal, MD

## 2022-08-05 NOTE — Progress Notes (Addendum)
New Rx sent in for cipro 500 mg BID x 3 days based on final urine culture.  Called patient's son with instructions to pick up new Rx.  He verbalized understanding.   Murvin Natal MD

## 2022-08-07 LAB — CULTURE, BLOOD (ROUTINE X 2)
Culture: NO GROWTH
Culture: NO GROWTH
Special Requests: ADEQUATE
Special Requests: ADEQUATE
